# Patient Record
Sex: Female | Born: 1955 | Race: White | Hispanic: No | Marital: Married | Smoking: Current every day smoker
Health system: Southern US, Community
[De-identification: ages and names within clinical notes are randomized; demographics above are authoritative.]

## PROBLEM LIST (undated history)

## (undated) DIAGNOSIS — R569 Unspecified convulsions: Secondary | ICD-10-CM

## (undated) DIAGNOSIS — G43909 Migraine, unspecified, not intractable, without status migrainosus: Secondary | ICD-10-CM

## (undated) DIAGNOSIS — K900 Celiac disease: Secondary | ICD-10-CM

## (undated) DIAGNOSIS — E785 Hyperlipidemia, unspecified: Secondary | ICD-10-CM

## (undated) DIAGNOSIS — G8929 Other chronic pain: Secondary | ICD-10-CM

## (undated) DIAGNOSIS — M1990 Unspecified osteoarthritis, unspecified site: Secondary | ICD-10-CM

## (undated) DIAGNOSIS — R51 Headache: Secondary | ICD-10-CM

## (undated) DIAGNOSIS — R519 Headache, unspecified: Secondary | ICD-10-CM

## (undated) DIAGNOSIS — K635 Polyp of colon: Secondary | ICD-10-CM

## (undated) DIAGNOSIS — K5792 Diverticulitis of intestine, part unspecified, without perforation or abscess without bleeding: Secondary | ICD-10-CM

## (undated) DIAGNOSIS — D279 Benign neoplasm of unspecified ovary: Secondary | ICD-10-CM

## (undated) DIAGNOSIS — M542 Cervicalgia: Secondary | ICD-10-CM

## (undated) HISTORY — DX: Cervicalgia: M54.2

## (undated) HISTORY — DX: Headache, unspecified: R51.9

## (undated) HISTORY — PX: OTHER SURGICAL HISTORY: SHX169

## (undated) HISTORY — DX: Unspecified osteoarthritis, unspecified site: M19.90

## (undated) HISTORY — DX: Headache: R51

## (undated) HISTORY — PX: OOPHORECTOMY: SHX86

## (undated) HISTORY — DX: Unspecified convulsions: R56.9

## (undated) HISTORY — DX: Polyp of colon: K63.5

## (undated) HISTORY — DX: Hyperlipidemia, unspecified: E78.5

## (undated) HISTORY — DX: Benign neoplasm of unspecified ovary: D27.9

## (undated) HISTORY — PX: BREAST ENHANCEMENT SURGERY: SHX7

## (undated) HISTORY — DX: Diverticulitis of intestine, part unspecified, without perforation or abscess without bleeding: K57.92

## (undated) HISTORY — DX: Celiac disease: K90.0

## (undated) HISTORY — DX: Other chronic pain: G89.29

## (undated) HISTORY — DX: Migraine, unspecified, not intractable, without status migrainosus: G43.909

---

## 1999-02-21 ENCOUNTER — Encounter: Payer: Self-pay | Admitting: Family Medicine

## 1999-02-21 ENCOUNTER — Encounter: Admission: RE | Admit: 1999-02-21 | Discharge: 1999-02-21 | Payer: Self-pay | Admitting: Family Medicine

## 1999-03-31 ENCOUNTER — Ambulatory Visit (HOSPITAL_BASED_OUTPATIENT_CLINIC_OR_DEPARTMENT_OTHER): Admission: RE | Admit: 1999-03-31 | Discharge: 1999-03-31 | Payer: Self-pay | Admitting: Surgery

## 1999-03-31 ENCOUNTER — Encounter (INDEPENDENT_AMBULATORY_CARE_PROVIDER_SITE_OTHER): Payer: Self-pay | Admitting: *Deleted

## 1999-05-26 ENCOUNTER — Encounter (INDEPENDENT_AMBULATORY_CARE_PROVIDER_SITE_OTHER): Payer: Self-pay

## 1999-10-14 ENCOUNTER — Encounter: Payer: Self-pay | Admitting: Surgery

## 1999-10-14 ENCOUNTER — Encounter: Admission: RE | Admit: 1999-10-14 | Discharge: 1999-10-14 | Payer: Self-pay | Admitting: Surgery

## 2000-05-01 HISTORY — PX: AUGMENTATION MAMMAPLASTY: SUR837

## 2000-05-28 ENCOUNTER — Other Ambulatory Visit: Admission: RE | Admit: 2000-05-28 | Discharge: 2000-05-28 | Payer: Self-pay | Admitting: Family Medicine

## 2001-08-23 ENCOUNTER — Encounter: Payer: Self-pay | Admitting: Family Medicine

## 2001-08-23 ENCOUNTER — Encounter: Admission: RE | Admit: 2001-08-23 | Discharge: 2001-08-23 | Payer: Self-pay | Admitting: Family Medicine

## 2005-07-11 ENCOUNTER — Ambulatory Visit: Payer: Self-pay | Admitting: Internal Medicine

## 2005-07-18 ENCOUNTER — Ambulatory Visit: Payer: Self-pay | Admitting: Internal Medicine

## 2005-07-18 ENCOUNTER — Other Ambulatory Visit: Admission: RE | Admit: 2005-07-18 | Discharge: 2005-07-18 | Payer: Self-pay | Admitting: Internal Medicine

## 2005-09-07 ENCOUNTER — Ambulatory Visit: Payer: Self-pay | Admitting: Gastroenterology

## 2005-11-06 ENCOUNTER — Encounter: Payer: Self-pay | Admitting: Internal Medicine

## 2006-03-27 ENCOUNTER — Ambulatory Visit: Payer: Self-pay | Admitting: Internal Medicine

## 2006-08-02 ENCOUNTER — Encounter: Payer: Self-pay | Admitting: Internal Medicine

## 2006-08-02 ENCOUNTER — Other Ambulatory Visit: Admission: RE | Admit: 2006-08-02 | Discharge: 2006-08-02 | Payer: Self-pay | Admitting: Internal Medicine

## 2006-08-02 ENCOUNTER — Ambulatory Visit: Payer: Self-pay | Admitting: Internal Medicine

## 2006-08-16 ENCOUNTER — Encounter: Payer: Self-pay | Admitting: Internal Medicine

## 2006-10-05 DIAGNOSIS — E039 Hypothyroidism, unspecified: Secondary | ICD-10-CM

## 2006-10-09 DIAGNOSIS — Z860100 Personal history of colon polyps, unspecified: Secondary | ICD-10-CM | POA: Insufficient documentation

## 2007-07-19 ENCOUNTER — Encounter: Payer: Self-pay | Admitting: Internal Medicine

## 2007-07-30 ENCOUNTER — Encounter: Payer: Self-pay | Admitting: Internal Medicine

## 2007-08-06 ENCOUNTER — Encounter: Payer: Self-pay | Admitting: Internal Medicine

## 2007-08-06 ENCOUNTER — Ambulatory Visit: Payer: Self-pay | Admitting: Internal Medicine

## 2007-08-06 ENCOUNTER — Other Ambulatory Visit: Admission: RE | Admit: 2007-08-06 | Discharge: 2007-08-06 | Payer: Self-pay | Admitting: Internal Medicine

## 2007-08-06 DIAGNOSIS — F17200 Nicotine dependence, unspecified, uncomplicated: Secondary | ICD-10-CM | POA: Insufficient documentation

## 2007-11-21 ENCOUNTER — Ambulatory Visit: Payer: Self-pay | Admitting: Internal Medicine

## 2007-11-21 DIAGNOSIS — L723 Sebaceous cyst: Secondary | ICD-10-CM

## 2007-11-27 ENCOUNTER — Ambulatory Visit: Payer: Self-pay | Admitting: Internal Medicine

## 2007-12-09 ENCOUNTER — Telehealth: Payer: Self-pay | Admitting: Internal Medicine

## 2007-12-13 ENCOUNTER — Telehealth: Payer: Self-pay | Admitting: Internal Medicine

## 2009-09-17 ENCOUNTER — Encounter: Payer: Self-pay | Admitting: Internal Medicine

## 2009-09-20 ENCOUNTER — Ambulatory Visit: Payer: Self-pay | Admitting: Internal Medicine

## 2009-09-20 ENCOUNTER — Encounter (INDEPENDENT_AMBULATORY_CARE_PROVIDER_SITE_OTHER): Payer: Self-pay | Admitting: *Deleted

## 2009-10-04 ENCOUNTER — Ambulatory Visit: Payer: Self-pay | Admitting: Internal Medicine

## 2009-10-06 ENCOUNTER — Encounter: Payer: Self-pay | Admitting: Internal Medicine

## 2010-05-29 LAB — CONVERTED CEMR LAB: Pap Smear: NORMAL

## 2010-05-31 NOTE — Miscellaneous (Signed)
Summary: previsit/rm  Clinical Lists Changes  Medications: Added new medication of OSMOPREP 1.102-0.398 GM  TABS (SOD PHOS MONO-SOD PHOS DIBASIC) As per prep instructions. - Signed Rx of OSMOPREP 1.102-0.398 GM  TABS (SOD PHOS MONO-SOD PHOS DIBASIC) As per prep instructions.;  #32 x 0;  Signed;  Entered by: Sherren Kerns RN;  Authorized by: Hilarie Fredrickson MD;  Method used: Electronically to [facility]  [phone]*, [address] [place], [place], [place]  [address], Ph: [phone] or [phone], Fax: [phone] Observations: Added new observation of ALLERGY REV: Done (09/20/2009 12:50)    Prescriptions: OSMOPREP 1.102-0.398 GM  TABS (SOD PHOS MONO-SOD PHOS DIBASIC) As per prep instructions.  #32 x 0   Entered by:   Sherren Kerns RN   Authorized by:   Hilarie Fredrickson MD   Signed by:   Sherren Kerns RN on 09/20/2009   Method used:   Electronically to        [facility]  [phone]* (retail)       [address]       [place], [place]  [address]       Ph: [phone] or [phone]       Fax: [phone]   RxID:   [id number]

## 2010-05-31 NOTE — Procedures (Signed)
Summary: Colonoscopy  Patient: Sara Lawson Note: All result statuses are Final unless otherwise noted.  Tests: (1) Colonoscopy (COL)   COL Colonoscopy           DONE     [facility]     [address]     [place], [place]  [address]           COLONOSCOPY PROCEDURE REPORT           PATIENT:  Sara, Lawson  MR#:  [id number]     BIRTHDATE:  Dec 04, 1955, 54 yrs. old  GENDER:  female     ENDOSCOPIST:  Wilhemina Bonito. Eda Keys, MD     REF. BY:  .Direct / Self Referral     PROCEDURE DATE:  10/04/2009     PROCEDURE:  Colonoscopy with snare polypectomy X 2     ASA CLASS:  Class II     INDICATIONS:  history of polyps ; INDEX EXAM WITH DR Sara Lawson [place]     [place] 11[place]06-[place] ( RECTAL POLYP ? TYPE); UNCLES AND COUSIN     W/ COLON CA. DENIES ANY SYMPTOMS     MEDICATIONS:   Fentanyl 100 mcg IV, Versed 10 mg IV, Benadryl 50     mg IV           DESCRIPTION OF PROCEDURE:   After the risks benefits and     alternatives of the procedure were thoroughly explained, informed     consent was obtained.  Digital rectal exam was performed and     revealed no abnormalities.   The LB CF-[id number] endoscope     was introduced through the anus and advanced to the cecum, which     was identified by both the appendix and ileocecal valve, without     limitations.Time to cecum = 6:52 min.  The quality of the prep was     excellent, using MoviPrep.  The instrument was then slowly     withdrawn (time = 11:20 min) as the colon was fully examined.     <<PROCEDUREIMAGES>>           FINDINGS:  A diminutive polyp was found in the cecum. Polyp was     snared without cautery. Retrieval was successful.   A diminutive     polyp was found in the descending colon. Polyp was snared without     cautery. Retrieval was successful.  An isolated right colon     diverticulum. Moderate diverticulosis was found in the sigmoid     colon w/ rectosigmoid stenosis.   Retroflexed views in the rectum     revealed  internal hemorrhoids.    The scope was then withdrawn     from the patient and the procedure completed.           COMPLICATIONS:  None     ENDOSCOPIC IMPRESSION:     1) Diminutive polyp in the cecum - removed     2) Diminutive polyp in the descending colon - removed     3) Moderate diverticulosis in the sigmoid colon WITH STENOSIS     4) Internal hemorrhoids           RECOMMENDATIONS:     1) Follow up colonoscopy in 5 years     2) GET PATHOLOGY REPORT FROM [facility]ONOSCOPY WITH DR Sara Lawson     Lawson 11-06-05           ______________________________     Wilhemina Bonito. Sara Lawson  Sara Hageman, MD           CC:  Sara Jacobson, MD; The Patient           n.     eSIGNED:   Wilhemina Bonito. Eda Keys at 10/04/2009 10:52 AM           Selinda Flavin, [id number]  Note: An exclamation mark (!) indicates a result that was not dispersed into the flowsheet. Document Creation Date: 10/04/2009 10:53 AM _______________________________________________________________________  (1) Order result status: Final Collection or observation date-time: 10/04/2009 10:42 Requested date-time:  Receipt date-time:  Reported date-time:  Referring Physician:   Ordering Physician: Fransico Setters [phone]) Specimen Source:  Source: Launa Grill Order Number: [id number] Lab site:   Appended Document: Colonoscopy records requested and Keri will send as soon as possible.  The record is off site and they only pull those 1 x per month.  I explained that this is needed ASAP and she is going to request STAT.  Appended Document: Colonoscopy recall in 5 yrs/09-2014     Procedures Next Due Date:    Colonoscopy: 09/2014  Appended Document: Colonoscopy called Keri again to ask about the path report and she said they have still not been off site to retrieve those records and there is no way she can get them any sooner.  I asked to speak to her manager to see if they could go out sooner and she advised me the manager plans those trips and she was  suppose to go out next week for the records.  She advised me she was not there and would leave a message to call me.

## 2010-05-31 NOTE — Letter (Signed)
Summary: Osmoprep Instructions  [facility]  [address] [place], [place] [address]   Phone: [phone]  Fax: [phone]       Sara Lawson    [birth year]    MRN: [id number]        Procedure Day /Date: Monday 10/04/09     Arrival Time: 9:00 am     Procedure Time: 10:00 am     Location of Procedure:                    x  [facility] (4th Floor)   PREPARATION FOR COLONOSCOPY WITH OSMOPREP  Starting 5 days prior to your procedure  09/29/09 do not eat nuts, seeds, popcorn, corn, beans, peas,  salads, or any raw vegetables.  Do not take any fiber supplements (e.g. Metamucil, Citrucel, and Benefiber). _________________________________________________________________________________________________  THE DAY BEFORE YOUR PROCEDURE             DATE: 10/03/09     DAY: Sunday  1.   Drink clear liquids the entire day - NO SOLID FOOD.  Drink at least 64 oz. of fluid during the day to prevent hydration and help the prep work efficiently.    2.   Do not drink anything colored red or purple.  Avoid juices with pulp.  No orange juice.              CLEAR LIQUIDS INCLUDE: Water Jello Ice Popsicles Tea (sugar ok, no milk/cream) Powdered fruit flavored drinks Coffee (sugar ok, no milk/cream) Gatorade Juice: apple, white grape, white cranberry  Lemonade Clear bullion, consomm, broth Carbonated beverages (any kind) Strained chicken noodle soup Hard Candy   3.   Beginning at 5:00 p.m. or 6:00 p.m. the night before your procedure, drink one dose (4 tablets with 8 oz. of any clear liquid) every 15 minutes for a total of 5 doses (20 tablets total).  ___________________________________________________________________________________________________   THE DAY OF YOUR PROCEDURE            DATE: 10/04/09      DAY: Monday  1.   Beginning at  5:00 a.m. (5 hours before procedure), drink one dose (4 tablets with 8 oz. of any clear liquid) every 15 minutes for a total of 3  doses (12 tablets).  2.   You may drink clear liquids until  8:00 a.m.(2 hours before exam).  Do not drink anything after this time.       MEDICATION INSTRUCTIONS  Unless otherwise instructed, you should take regular prescription medications with a small sip of water as early as possible the morning of your procedure.     Additional medication instructions: n/a          OTHER INSTRUCTIONS  You will need a responsible adult at least 56 years of age to accompany you and drive you home.   This person must remain in the waiting room during your procedure.  Wear loose fitting clothing that is easily removed.  Leave jewelry and other valuables at home.  However, you may wish to bring a book to read or an iPod/MP3 player to listen to music as you wait for your procedure to start.  Remove all body piercing jewelry and leave at home.  Total time from sign-in until discharge is approximately 2-3 hours.  You should go home directly after your procedure and rest.  You can resume normal activities the day after your procedure.  The day of your procedure you should not:  Drive   Make legal decisions   Operate machinery   Drink alcohol   Return to work  You will receive specific instructions about eating, activities and medications before you leave.   The above instructions have been reviewed and explained to me by   Sherren Kerns RN  Sep 20, 2009 1:30 PM    I fully understand and can verbalize these instructions _____________________________ Date _________

## 2010-05-31 NOTE — Procedures (Signed)
Summary: Colon/[facility]  [facility]   Imported By: Lester Wabeno 02/03/2010 08:51:15  _____________________________________________________________________  External Attachment:    Type:   Image     Comment:   External Document

## 2010-05-31 NOTE — Letter (Signed)
Summary: Patient Notice- Polyp Results  [facility]  [address] [place], [place] [address]   Phone: [phone]  Fax: [phone]        October 06, 2009 MRN: [id number]    Sara Lawson [address] [place], [place]  [address]    Dear [name],  I am pleased to inform you that the colon polyp(s) removed during your recent colonoscopy was (were) found to be benign (no cancer detected) upon pathologic examination.  I recommend you have a repeat colonoscopy examination in 5 years to look for recurrent polyps, as having colon polyps increases your risk for having recurrent polyps or even colon cancer in the future.  Should you develop new or worsening symptoms of abdominal pain, bowel habit changes or bleeding from the rectum or bowels, please schedule an evaluation with either your primary care physician or with me.  Additional information/recommendations:  __ No further action with gastroenterology is needed at this time. Please      follow-up with your primary care physician for your other healthcare      needs.   Please call us if you are having persistent problems or have questions about your condition that have not been fully answered at this time.  Sincerely,  Hilarie Fredrickson MD  This letter has been electronically signed by your physician.  Appended Document: Patient Notice- Polyp Results letter mailed.

## 2010-09-16 NOTE — Op Note (Signed)
[facility]  Patient:    Sara Lawson               MRN: [id number] Proc. Date: 03/31/99 Adm. Date:  [id number] Attending:  Charlton Haws CC:         Duncan Dull, M.D.             Dr. Jena Gauss, DRI                           Operative Report  PREOPERATIVE DIAGNOSIS:  Right breast mass, upper outer quadrant.  POSTOPERATIVE DIAGNOSIS:  Right breast mass, upper outer quadrant.  OPERATION PERFORMED:  Removal of right breast mass.  SURGEON:  Currie Paris, M.D.  ANESTHESIA:  MAC.  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  After discussion with the patient, we initially planned to do some large core biopsies of these, but she elected to have the palpable one removed to see what that was before proceeding with her desire to simply follow these others by ultrasound for changes.  DESCRIPTION OF PROCEDURE:  The patient was brought to the operating room and prior to being given any sedation, the breast was examined and the palpable mass identified both by the patient and myself and marked.  She was then given IV sedation and the breast was prepped and draped.  1% Xylocaine was infiltrated and the incision was made in the upper outer quadrant of the breast overlying the mass. I divided some subcutaneous tissues and could palpate the mass within what appeared to be very dense fibrotic breast tissue.  I put a 3-0 Vicryl through the mass so I could get some traction on it to keep it from moving around and then excised it  with a rim of breast tissue completely around it so that it came out  intact without actually getting into the mass itself.  This was done entirely with the cautery.  When the mass was removed, the wound was irrigated and hemostasis achieved with the cautery.  When everything appeared to dry, the breast was closed with 3-0 Vicryl in layers followed by 4-0 Monocryl subcuticular plus Steri-Strips.  The patient  tolerated the procedure well.  There were no operative complications.  All counts were correct. DD:  03/31/99 TD:  03/31/99 Job: [id number]

## 2011-01-16 ENCOUNTER — Other Ambulatory Visit: Payer: Self-pay | Admitting: Family Medicine

## 2011-01-16 DIAGNOSIS — F17200 Nicotine dependence, unspecified, uncomplicated: Secondary | ICD-10-CM | POA: Insufficient documentation

## 2011-01-16 DIAGNOSIS — Z1231 Encounter for screening mammogram for malignant neoplasm of breast: Secondary | ICD-10-CM

## 2011-01-16 DIAGNOSIS — Z780 Asymptomatic menopausal state: Secondary | ICD-10-CM

## 2011-01-17 ENCOUNTER — Other Ambulatory Visit: Payer: Self-pay | Admitting: Family Medicine

## 2011-01-17 DIAGNOSIS — R102 Pelvic and perineal pain: Secondary | ICD-10-CM

## 2011-01-20 ENCOUNTER — Other Ambulatory Visit: Payer: Self-pay

## 2011-01-23 ENCOUNTER — Ambulatory Visit
Admission: RE | Admit: 2011-01-23 | Discharge: 2011-01-23 | Disposition: A | Discharge status: Home / Self Care | Payer: 59 | Source: Ambulatory Visit | Attending: Family Medicine | Admitting: Family Medicine

## 2011-01-23 DIAGNOSIS — R102 Pelvic and perineal pain: Secondary | ICD-10-CM

## 2011-01-25 ENCOUNTER — Ambulatory Visit: Payer: Self-pay

## 2011-01-27 ENCOUNTER — Other Ambulatory Visit: Payer: Self-pay

## 2011-01-27 ENCOUNTER — Ambulatory Visit: Payer: Self-pay

## 2011-08-24 ENCOUNTER — Other Ambulatory Visit (HOSPITAL_COMMUNITY): Payer: Self-pay | Admitting: Nurse Practitioner

## 2011-08-24 DIAGNOSIS — Z1231 Encounter for screening mammogram for malignant neoplasm of breast: Secondary | ICD-10-CM

## 2011-09-22 ENCOUNTER — Ambulatory Visit (HOSPITAL_COMMUNITY)
Admission: RE | Admit: 2011-09-22 | Discharge: 2011-09-22 | Disposition: A | Discharge status: Home / Self Care | Payer: 59 | Source: Ambulatory Visit | Attending: Nurse Practitioner | Admitting: Nurse Practitioner

## 2011-09-22 DIAGNOSIS — Z1231 Encounter for screening mammogram for malignant neoplasm of breast: Secondary | ICD-10-CM | POA: Insufficient documentation

## 2012-08-22 ENCOUNTER — Telehealth: Payer: Self-pay | Admitting: Neurology

## 2012-08-22 NOTE — Telephone Encounter (Signed)
Patient called because insurance needed justification for her brand medications. I called her back and advised her that our records show that justifications have been sent and approved through December, 2014. Patient is very appreciative of our immediate follow through.

## 2012-08-23 ENCOUNTER — Telehealth: Payer: Self-pay

## 2012-08-23 NOTE — Telephone Encounter (Signed)
Patient called front desk saying [employer] needs [place] to fax forms saying she cannot take generic medications.  I will initiate prior auth.  Must wait for ins response.

## 2012-08-26 ENCOUNTER — Telehealth: Payer: Self-pay

## 2012-08-26 NOTE — Telephone Encounter (Signed)
[employer] sent correspondence stating they do not handle medication benefit claims for this patient.  They indicated we need to contact [facility] instead.  I have completed and faxed the forms required to [facility].  Pending response.

## 2012-09-04 DIAGNOSIS — B9681 Helicobacter pylori [H. pylori] as the cause of diseases classified elsewhere: Secondary | ICD-10-CM | POA: Insufficient documentation

## 2012-09-04 DIAGNOSIS — K219 Gastro-esophageal reflux disease without esophagitis: Secondary | ICD-10-CM | POA: Insufficient documentation

## 2012-09-11 ENCOUNTER — Telehealth: Payer: Self-pay | Admitting: Internal Medicine

## 2012-09-11 NOTE — Telephone Encounter (Signed)
Patient calling to set up colonoscopy and EGD. Explained to her that if she is having problems she would need OV first. She states she is having bloating and abdominal pain after eating. States she was also treated for h.pylori in the past by her PCP. Offered OV with extender or in June with Dr. Marina Goodell. Scheduled patient on 09/16/12 at 8:30 AM with Willette Cluster, NP.

## 2012-09-16 ENCOUNTER — Ambulatory Visit: Payer: 59 | Admitting: Nurse Practitioner

## 2012-09-20 ENCOUNTER — Telehealth: Payer: Self-pay

## 2012-09-20 NOTE — Telephone Encounter (Signed)
Patient called regarding her Medication.  She says the pharmacy told her a prior Berkley Harvey was needed on Keppra and Vimpat.  I already submitted the prior auth forms and received a response saying "Prior Authorization is not required at this time.  The pharmacy may contact the Help Desk at [phone] for further assistance with claims."  I told the patient I would call the insurance back to try and figure out what is going on since the pharmacy is saying it needs a PA, but the ins is saying it does not.

## 2012-09-20 NOTE — Telephone Encounter (Signed)
(  My computer froze and previous encounter was closed in error)   I called and spoke with Rodney Booze at [facility] at [phone].  They first told me the medication was already approved until 04/30/2013.  Then they said they may have gotten the medications mixed up and the approval was only good for one med, not both.  They asked me to resubmit the prior auth info, which I did.   Insurance company just faxed [place] a denial letter for Keppra XR Brand Name.  They say the patient does not meet the conditions necessary for coverage.  I called the insurance back again.  Spoke with [employer].  She confirmed the request was denied.  She said they want the patient to try generic Keppra XR for at least 4 weeks.  Although I advised them she has already tried and failed generic Keppra, they still denied the PA.  There was nothing more she could do for me.  I asked her where I could call to appeal.  She said [phone].  I called this number multiple times and it is a fax.  I called [facility] again and they said the number given was the only number listed for appeals.  A letter of med necessity for appeal may be faxed to [phone] or mailed to [facility] [address] [place], [place] [address].    I called the patient at home, got no answer.  Left message.  Called cell, got no answer.  Left message.  The patient called me back right away.  She said the number she has [phone] for Provider Services.  I told her I would call them and try to do an appeal via phone.  This was an automated system that did not provide me with any info.  I spoke with the patient again.  She said she spoke with [facility] and they said I would need to call them back and do an appeal over the phone.  She said although we noted she had tried and failed generic, we did not specify how long she tried the generic, therefore they denied the request.  I called ins back at [phone].  Spoke with [place].  She was not able to assist me.  She transferred me  to

## 2012-09-24 NOTE — Telephone Encounter (Signed)
This medication was finally approved.  (I was on the phone with ins on and off for nearly 2 hours) Approval Ref # [id number] effective 09/20/2012-09/20/2017.

## 2012-10-31 ENCOUNTER — Ambulatory Visit: Payer: Self-pay | Admitting: Neurology

## 2012-10-31 ENCOUNTER — Encounter: Payer: Self-pay | Admitting: Neurology

## 2012-12-17 ENCOUNTER — Other Ambulatory Visit: Payer: Self-pay | Admitting: Neurology

## 2012-12-17 ENCOUNTER — Other Ambulatory Visit: Payer: Self-pay | Admitting: Gastroenterology

## 2012-12-17 DIAGNOSIS — R1013 Epigastric pain: Secondary | ICD-10-CM

## 2012-12-18 NOTE — Telephone Encounter (Signed)
Rx signed and faxed.

## 2012-12-31 ENCOUNTER — Ambulatory Visit (HOSPITAL_COMMUNITY)
Admission: RE | Admit: 2012-12-31 | Discharge: 2012-12-31 | Disposition: A | Discharge status: Home / Self Care | Payer: 59 | Source: Ambulatory Visit | Attending: Gastroenterology | Admitting: Gastroenterology

## 2012-12-31 ENCOUNTER — Encounter (HOSPITAL_COMMUNITY)
Admission: RE | Admit: 2012-12-31 | Discharge: 2012-12-31 | Disposition: A | Discharge status: Home / Self Care | Payer: 59 | Source: Ambulatory Visit | Attending: Gastroenterology | Admitting: Gastroenterology

## 2012-12-31 DIAGNOSIS — R1013 Epigastric pain: Secondary | ICD-10-CM

## 2012-12-31 MED ORDER — TECHNETIUM TC 99M MEBROFENIN IV KIT
5.2000 | PACK | Freq: Once | INTRAVENOUS | Status: AC | PRN
  Administered 2012-12-31: 5 via INTRAVENOUS

## 2012-12-31 MED ORDER — SINCALIDE 5 MCG IJ SOLR
0.0200 ug/kg | Freq: Once | INTRAMUSCULAR | Status: AC
  Administered 2012-12-31: 11:00:00 via INTRAVENOUS

## 2013-01-15 ENCOUNTER — Other Ambulatory Visit: Payer: Self-pay | Admitting: Gastroenterology

## 2013-01-15 DIAGNOSIS — R1013 Epigastric pain: Secondary | ICD-10-CM

## 2013-01-21 ENCOUNTER — Other Ambulatory Visit: Payer: 59

## 2013-01-30 ENCOUNTER — Ambulatory Visit
Admission: RE | Admit: 2013-01-30 | Discharge: 2013-01-30 | Disposition: A | Discharge status: Home / Self Care | Payer: 59 | Source: Ambulatory Visit | Attending: Gastroenterology | Admitting: Gastroenterology

## 2013-01-30 ENCOUNTER — Other Ambulatory Visit: Payer: 59

## 2013-01-30 ENCOUNTER — Ambulatory Visit (HOSPITAL_COMMUNITY): Payer: 59

## 2013-01-30 DIAGNOSIS — R1013 Epigastric pain: Secondary | ICD-10-CM

## 2013-01-30 MED ORDER — IOHEXOL 300 MG/ML  SOLN
100.0000 mL | Freq: Once | INTRAMUSCULAR | Status: AC | PRN
  Administered 2013-01-30: 100 mL via INTRAVENOUS

## 2013-02-10 DIAGNOSIS — K900 Celiac disease: Secondary | ICD-10-CM | POA: Insufficient documentation

## 2013-04-17 ENCOUNTER — Telehealth: Payer: Self-pay

## 2013-04-17 NOTE — Telephone Encounter (Signed)
[facility] sent [place] a letter stating they have approved our request for coverage on Keppra XR effective until 04/14/2018 Ref # [id number]

## 2013-04-17 NOTE — Telephone Encounter (Signed)
[facility] sent [place] a letter stating they have approved our request for coverage on Vimpat effective until 04/14/2018 Ref # [id number]

## 2013-04-23 ENCOUNTER — Other Ambulatory Visit: Payer: Self-pay | Admitting: Neurology

## 2013-04-23 MED ORDER — LACOSAMIDE 200 MG PO TABS: ORAL_TABLET | ORAL | Status: DC

## 2013-08-05 ENCOUNTER — Telehealth: Payer: Self-pay | Admitting: Neurology

## 2013-08-05 MED ORDER — KEPPRA XR 500 MG PO TB24: 1000.0000 mg | ORAL_TABLET | Freq: Every evening | ORAL | Status: DC

## 2013-08-05 NOTE — Telephone Encounter (Signed)
Rx has been sent  

## 2013-08-05 NOTE — Telephone Encounter (Signed)
Patient called to request refill of Keppra, states her last dose will be on April 13 and then she will need another 30 days called in. Patient scheduled follow up appointment with Cecille Rubin on 09/11/13.

## 2013-09-02 ENCOUNTER — Other Ambulatory Visit: Payer: Self-pay | Admitting: Neurology

## 2013-09-11 ENCOUNTER — Encounter: Payer: Self-pay | Admitting: Nurse Practitioner

## 2013-09-11 ENCOUNTER — Encounter (INDEPENDENT_AMBULATORY_CARE_PROVIDER_SITE_OTHER): Payer: Self-pay

## 2013-09-11 ENCOUNTER — Ambulatory Visit (INDEPENDENT_AMBULATORY_CARE_PROVIDER_SITE_OTHER): Payer: 59 | Admitting: Nurse Practitioner

## 2013-09-11 VITALS — BP 116/76 | HR 66 | Ht 65.0 in | Wt 164.0 lb

## 2013-09-11 DIAGNOSIS — R569 Unspecified convulsions: Secondary | ICD-10-CM

## 2013-09-11 DIAGNOSIS — G40209 Localization-related (focal) (partial) symptomatic epilepsy and epileptic syndromes with complex partial seizures, not intractable, without status epilepticus: Secondary | ICD-10-CM | POA: Insufficient documentation

## 2013-09-11 DIAGNOSIS — Z5181 Encounter for therapeutic drug level monitoring: Secondary | ICD-10-CM | POA: Insufficient documentation

## 2013-09-11 MED ORDER — LACOSAMIDE 200 MG PO TABS: ORAL_TABLET | ORAL | Status: DC

## 2013-09-11 MED ORDER — KEPPRA XR 500 MG PO TB24: 1000.0000 mg | ORAL_TABLET | Freq: Every day | ORAL | Status: DC

## 2013-09-11 NOTE — Progress Notes (Signed)
[facility]  PATIENT: Sara Lawson DOB: [birth year]   REASON FOR VISIT: Followup for seizure disorder   HISTORY OF PRESENT ILLNESS: Sara Lawson, 58 year old female returns for followup. She has a history of complex partial seizure disorder. Last seizure was 2/24/ 2014. She was last seen in this office in 06/26/2012 Dr. Krista Blue. Her Vimpat dose was increased at that time and she has not had further seizure activity. She denies any side effects to Keppra or Vimpat. She has not had any falls, she returns for reevaluation.   HISTORY: She started to have seizure since Jan 2010 after she felt and slammed her head at an  ice cooler at gas station, no LOC,  after that she started to have the episodes. She has past medical history of hypothyroidism, thyroid cysts, chronic neck pain, headache, presenting with 6 month history of intermittent episodes of confusion. She reported all  presentation has similar seminology.  She would  suddenly lost connection with the reality, feel like " I am not there".  never totally lost consciousness,  sometimes,  it's proceed by smell some strong chemical smells,  or  exhaustion, stomach knot like sensation, with nausea.  MRI :mild-moderate atrophy, EEG normal. Jun 26 2012  She continues to have recurrent seizures, sometimes multiple episodes in a day, over years, she was changed to Edisto XR, currently taking 500 mg 2 tablets every night, also on titrating dose of Vimpat, she is taking 150 mg twice a day,   Last seizure was in June 2013, in June 24 2012, she woke up having recurrent episode of seizure every 30-60 minutes, for a total of 7 episodes, stomach cramping, overwhelming sensation, lasting for 1 minutes, she took extra dose of 150 mg vimpat, one more recurrent episode afterwards, and then stop, she denies significant side effects from medications    REVIEW OF SYSTEMS: Full 14 system review of systems performed and notable only for those  listed, all others are neg:  Constitutional: N/A  Cardiovascular: N/A  Ear/Nose/Throat: N/A  Skin: N/A  Eyes: N/A  Respiratory: N/A  Gastroitestinal: N/A  Hematology/Lymphatic: N/A  Endocrine: N/A Musculoskeletal:N/A  Allergy/Immunology: N/A  Neurological: Seizure disorder  Psychiatric: N/A Sleep : NA   ALLERGIES: Allergies  Allergen Reactions  . Penicillins     REACTION: rash---she is able to take cephalosporins    HOME MEDICATIONS: Outpatient Prescriptions Prior to Visit  Medication Sig Dispense Refill  . KEPPRA XR 500 MG 24 hr tablet TAKE 2 TABLETS (1,000 MG TOTAL) BY MOUTH NIGHTLY.  60 tablet  0  . lacosamide (VIMPAT) 200 MG TABS tablet TAKE 1 TABLET BY MOUTH TWICE DAILY  60 tablet  5  . ALPRAZOLAM PO Take by mouth as needed.      Marland Kitchen omeprazole (PRILOSEC) 40 MG capsule        No facility-administered medications prior to visit.    PAST MEDICAL HISTORY: Past Medical History  Diagnosis Date  . Chronic head pain   . Chronic neck pain   . Migraine   . Seizure Celiac disease     PAST SURGICAL HISTORY: Past Surgical History  Procedure Laterality Date  . Appendix removed      FAMILY HISTORY: History reviewed. No pertinent family history.  SOCIAL HISTORY: History   Social History  . Marital Status: Married    Spouse Name: Sara Lawson    Number of Children: 3  . Years of Education: N/A   Occupational History  . Not on file.   Social  History Main Topics  . Smoking status: Current Every Day Smoker  . Smokeless tobacco: Never Used  . Alcohol Use: Not on file  . Drug Use: Not on file  . Sexual Activity: Not on file   Other Topics Concern  . Not on file   Social History Narrative   Patient lives at home with her husband Sara Lawson and been married for 10 years.    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   Patient has 3 children.   Patient has some college education.   Patient drinks 6 cups of coffee daily.            PHYSICAL EXAM  Filed Vitals:    09/11/13 0938  BP: 116/76  Pulse: 66  Height: 5\' 5"  (1.651 m)  Weight: 164 lb (74.39 kg)   Body mass index is 27.29 kg/(m^2).  Generalized: Well developed, in no acute distress  Head: normocephalic and atraumatic,. Oropharynx benign  Neck: Supple, no carotid bruits  Cardiac: Regular rate rhythm, no murmur  Musculoskeletal: No deformity   Neurological examination   Mentation: Alert oriented to time, place, history taking. Follows all commands speech and language fluent  Cranial nerve II-XII: Fundoscopic exam reveals sharp disc margins.Pupils were equal round reactive to light extraocular movements were full, visual field were full on confrontational test. Facial sensation and strength were normal. hearing was intact to finger rubbing bilaterally. Uvula tongue midline. head turning and shoulder shrug were normal and symmetric.Tongue protrusion into cheek strength was normal. Motor: normal bulk and tone, full strength in the BUE, BLE,  No focal weakness Coordination: finger-nose-finger, heel-to-shin bilaterally, no dysmetria Reflexes: Brachioradialis 2/2, biceps 2/2, triceps 2/2, patellar 2/2, Achilles 2/2, plantar responses were flexor bilaterally. Gait and Station: Rising up from seated position without assistance, normal stance,  moderate stride, good arm swing, smooth turning, able to perform tiptoe, and heel walking without difficulty. Tandem gait is steady  DIAGNOSTIC DATA (LABS, IMAGING, TESTING) - ASSESSMENT AND PLAN  58 y.o. year old female  has a past medical history of complex partial seizure, with last seizure in February  2014. She is currently on Keppra and Vimpat without side effects.  Continue Vimpat and Keppra at current dose will refill Followup yearly and  when necessary Dennie Bible, [facility], [facility], [facility] [address], [place], [place] [address] [phone]

## 2013-09-11 NOTE — Patient Instructions (Signed)
Continue Vimpat and Keppra at current dose will refill Followup yearly and  when necessary

## 2013-12-05 ENCOUNTER — Encounter: Payer: Self-pay | Admitting: Internal Medicine

## 2014-04-01 ENCOUNTER — Other Ambulatory Visit: Payer: Self-pay

## 2014-04-01 MED ORDER — LACOSAMIDE 200 MG PO TABS: ORAL_TABLET | ORAL | Status: DC

## 2014-04-08 ENCOUNTER — Telehealth: Payer: Self-pay | Admitting: Nurse Practitioner

## 2014-04-08 NOTE — Telephone Encounter (Signed)
Rx was already sent on 12/02.  I called the pharmacy.  They have Rx and will fill it today.  I called the patient back.  She is aware.

## 2014-04-08 NOTE — Telephone Encounter (Signed)
Patient stated Pharmacy has been trying to get Rx refill request for  lacosamide (VIMPAT) 200 MG TABS tablet for over a week.  Please call and advise.

## 2014-07-22 ENCOUNTER — Ambulatory Visit (INDEPENDENT_AMBULATORY_CARE_PROVIDER_SITE_OTHER): Payer: 59 | Admitting: Obstetrics & Gynecology

## 2014-07-22 ENCOUNTER — Encounter: Payer: Self-pay | Admitting: Obstetrics & Gynecology

## 2014-07-22 VITALS — BP 122/78 | HR 72 | Resp 16 | Ht 64.5 in | Wt 167.4 lb

## 2014-07-22 DIAGNOSIS — R938 Abnormal findings on diagnostic imaging of other specified body structures: Secondary | ICD-10-CM

## 2014-07-22 DIAGNOSIS — Z124 Encounter for screening for malignant neoplasm of cervix: Secondary | ICD-10-CM

## 2014-07-22 DIAGNOSIS — N950 Postmenopausal bleeding: Secondary | ICD-10-CM | POA: Diagnosis not present

## 2014-07-22 DIAGNOSIS — R9389 Abnormal findings on diagnostic imaging of other specified body structures: Secondary | ICD-10-CM

## 2014-07-22 MED ORDER — ESTRADIOL 0.1 MG/GM VA CREA: TOPICAL_CREAM | VAGINAL | Status: DC

## 2014-07-22 NOTE — Progress Notes (Signed)
59 y.o. G3P3 MarriedCaucasianF here for new patient evaluation.  PCP:  Vicenta Aly.  Pt referred due to episode of PMP bleeding that occurrred about two months ago.  This was while she had a UTI.  Was also on antibiotics per pt's hx.  Denied other symptoms but reports she does have some lower pelvic pain.  No urinary changes now.  Reports bowel habits are "normal for her".  Accompanied by spouse today.  Menopausal for a little over 10 years.  No h/o HRT use.  Patient's last menstrual period was 05/01/2004.          Sexually active: No.  The current method of family planning is post menopausal status.    Exercising: No.  not regularly Smoker:  Current every day smoker  Health Maintenance: Pap:  3 years ago History of abnormal Pap:  yes MMG:  09/22/11-normal Colonoscopy:  2014-repeat in 5 years, Dr. Collene Mares BMD:   2003 TDaP:  2015    reports that she has been smoking.  She has never used smokeless tobacco. She reports that she drinks alcohol. She reports that she does not use illicit drugs.  Past Medical History  Diagnosis Date  . Chronic head pain   . Chronic neck pain   . Migraine     not currently  . Seizure   . Celiac disease     diag 2014 Dr Collene Mares    Past Surgical History  Procedure Laterality Date  . Appendix removed    . 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 Breast enhancement surgery      Current Outpatient Prescriptions  Medication Sig Dispense Refill  . KEPPRA XR 500 MG 24 hr tablet Take 2 tablets (1,000 mg total) by mouth daily. 60 tablet 11  . lacosamide (VIMPAT) 200 MG TABS tablet TAKE 1 TABLET BY MOUTH TWICE DAILY 60 tablet 5  . SYNTHROID 100 MCG tablet      No current facility-administered medications for this visit.    Family History  Problem Relation Age of Onset  . Colon cancer Mother     had hysterectomy in late 30s-not sure if due to any problems, ? Celiac disease  . Colon cancer Maternal Uncle     x2  . Hypertension Mother   .  Diabetes Mother   . Diabetes Maternal Grandmother   . Diabetes Maternal Uncle     x2  . Diabetes Maternal Uncle   . Stroke Other     maternal great grandfather-diabetes  . Heart disease Mother   . Dementia Mother   . Thyroid disease Other     maternal side  . Diabetes Sister   . Diabetes Brother     type 2    ROS:  Pertinent items are noted in HPI.  Otherwise, a comprehensive ROS was negative.  Exam:   BP 122/78 mmHg  Pulse 72  Resp 16  Ht 5' 4.5" (1.638 m)  Wt 167 lb 6.4 oz (75.932 kg)  BMI 28.30 kg/m2  LMP 05/01/2004  Breastfeeding? No    Height: 5' 4.5" (163.8 cm)  Ht Readings from Last 3 Encounters:  07/22/14 5' 4.5" (1.638 m)  09/11/13 5\' 5"  (1.651 m)  08/06/07 5\' 4"  (1.626 m)    General appearance: alert, cooperative and appears stated age Head: Normocephalic, without obvious abnormality, atraumatic Neck: no adenopathy, supple, symmetrical, trachea midline and thyroid normal to inspection and palpation Lungs: clear to auscultation bilaterally Breasts: normal appearance, no masses or  tenderness Heart: regular rate and rhythm Abdomen: soft, non-tender; bowel sounds normal; no masses,  no organomegaly Extremities: extremities normal, atraumatic, no cyanosis or edema Skin: Skin color, texture, turgor normal. No rashes or lesions Lymph nodes: Cervical, supraclavicular, and axillary nodes normal. No abnormal inguinal nodes palpated Neurologic: Grossly normal   Pelvic: External genitalia:  no lesions              Urethra:  normal appearing urethra with no masses, tenderness or lesions              Bartholins and Skenes: normal                 Vagina: normal appearing vagina with normal color and discharge, no lesions              Cervix: no lesions              Pap taken: Yes.   Bimanual Exam:  Uterus:  normal size, contour, position, consistency, mobility, non-tender              Adnexa: normal adnexa and no mass, fullness, tenderness               Rectovaginal:  Confirms               Anus:  normal sphincter tone, no lesions  Endometrial biopsy recommended.  Discussed with patient.  Verbal and written consent obtained.   Procedure:  Speculum placed.  Cervix visualized and cleansed with betadine prep.  A single toothed tenaculum was applied to the anterior lip of the cervix.  Milex dilator was needed for cervical dilation.  Endometrial pipelle was advanced through the cervix into the endometrial cavity without difficulty.  Pipelle passed to 6.5cm.  Suction applied and pipelle removed with good tissue sample obtained.  Tenculum removed.  No bleeding noted.  Patient tolerated procedure well.  Chaperone was present for exam.  A:  Thickened endometrium Possible endometrial polyp PMP bleeding RLQ pain Breast implants  P:   Endometrial biopsy pending Pap with HR HPV obtained Plan to proceed with CT abd/pelvis if nothing is found on biopsy but if polyp tissue is noted, will have pt return for [facility]. Estrace cream topically to urethra every other day MMG recommended as well

## 2014-07-24 LAB — IPS PAP TEST WITH REFLEX TO HPV

## 2014-07-30 ENCOUNTER — Telehealth: Payer: Self-pay | Admitting: Obstetrics & Gynecology

## 2014-07-30 DIAGNOSIS — N840 Polyp of corpus uteri: Secondary | ICD-10-CM

## 2014-07-30 NOTE — Telephone Encounter (Signed)
-----   Message from Megan Salon, MD sent at 07/28/2014  1:19 PM EDT ----- Inform pt pap is normal and biopsy showed piece of endometrial polyp.  Needs SHGM to see how large polyp is and to see if needs removal.

## 2014-07-30 NOTE — Telephone Encounter (Signed)
Ativan 1.0mg  po to be taken 1 hour before coming.  #5/0RF Cytotec 132mcg placed vaginally pm and am before ultrasound procedure.  This may make her cramp some so she can use anti-inflammatories if needed.

## 2014-07-30 NOTE — Telephone Encounter (Signed)
Patient is calling to get her results from her last visit.

## 2014-07-30 NOTE — Telephone Encounter (Signed)
Spoke with patient and message from Dr. Sabra Heck given. Patient agreeable to planning Sonohysterogram and appointment scheduled for 08/06/14. Order placed.   Patient states she would like premedication treatment as she is very nervous about further procedures after endometrial biopsy. Patient states she will have a ride to procedure. Advised would request Dr. Sabra Heck review and obtain plan of care and return her call. She is agreeable to this.

## 2014-07-31 NOTE — Telephone Encounter (Signed)
Pt left voicemail morning of 07/31/14 to cancel her appointment. Returned call. Pt states she does not wish to reschedule at this time. Pt states "the test results came back negative and I don't want to put myself through that if the results were negative." Pt agreeable to nurse return call.  Pts ultrasound appointments cancelled per pt request.  Pt best: [phone]

## 2014-08-03 NOTE — Telephone Encounter (Signed)
Dr. Sabra Heck can you review and advise as patient does not wish to reschedule.

## 2014-08-04 ENCOUNTER — Encounter: Payer: Self-pay | Admitting: Obstetrics & Gynecology

## 2014-08-06 ENCOUNTER — Other Ambulatory Visit: Payer: 59 | Admitting: Obstetrics & Gynecology

## 2014-08-06 ENCOUNTER — Other Ambulatory Visit: Payer: 59

## 2014-08-07 NOTE — Telephone Encounter (Signed)
Reviewed with Dr Sabra Heck. Call to patient to discuss Dr Ammie Ferrier recommendations.  Patient states she does not feel SHGM is necessary "since not cancer." States endometrial biopsy was very painful. Advised although biopsy result is negative, this is incomplete evaluation.  Discussed the need to complete evaluation of endometrial cavity and/or removal of polyp. If feels unable to tolerate [facility], can proceed with Hysteroscopy, polyp resection, D&C at hospital.  Patient prefers this option. Advised can proceed with precert and scheduling. Dr Sabra Heck will discuss procedure further at surgery consult to be scheduled once procedure date confirmed.  Advised patient recommed scheduling in next four weeks.  She is agreeable.    Routing to provider for final review. Patient agreeable to disposition. Will close encounter

## 2014-08-07 NOTE — Telephone Encounter (Signed)
Gay Filler, Can you call pt before I write a letter.  She had PMP bleeding with endometrial polyp on pathology.  She needs [facility] for further evaluation or hysteroscopy to just remove the polyp.  CC: Karen Chafe

## 2014-08-10 ENCOUNTER — Telehealth: Payer: Self-pay | Admitting: Obstetrics & Gynecology

## 2014-08-10 NOTE — Telephone Encounter (Signed)
Call to patient to go over surgery benefits. Patient states that she does not want to proceed with the surgery. States that she doesn't deem it imperative since it has already been determined that there is no cancer. I advised patient that I would send message to provider and that she would be contacted if there were additional concerns to be addressed. Patient agreeable.

## 2014-08-10 NOTE — Telephone Encounter (Signed)
See phone note from 08-07-14. I spoke with patient in detail regarding Dr Ammie Ferrier recommendation for further evaluation with sonohystogram to evaluate polyp and uterine cavity or proceed with polyp resection. Patient preferred to proceed with Hysteroscopy/polyp resection/D&C. Chart sent to precert and patient advised Sabrina in billing department that she did not wish to proceed.   Call back to patient, VM confirms confirm correct phone number. Left message to call back to Gay Filler, nurse manager that she spoke to on Friday.   Dr Sabra Heck, [facility] [place]. I have called patient and left message. Any additional instruction?

## 2014-08-13 NOTE — Telephone Encounter (Signed)
Call to patient to follow-up on recommended SHGM versus Hysteroscopy/polyp resection/D&C. See previous phone note. Again discussed Dr Ammie Ferrier recommendations for additional evaluation of endometrial cavity and possible removal of polyp. Patient initially preferred to proceed to Hysteroscopy/D&C but now declines to schedule. Has multiple questions regarding pros and cons of each procedure, likelihood that [facility] would still result in need for hysteroscopy and need to proceed with any procedure at all. Advised I recommend she come in to office for consult to discuss all options with Dr Sabra Heck. Patient only willing too do this if would need pre-op visit anyway. Advised this would be required and if decides to proceed with hysteroscopy, this could count as pre-op if surgery is done within 30 days. Patient agreeable to consult and scheduled for 08-31-14 with Dr Sabra Heck.  Agree with above plan?

## 2014-08-13 NOTE — Telephone Encounter (Signed)
Agreed.  Thank you.  Ok to close encounter.

## 2014-08-28 ENCOUNTER — Other Ambulatory Visit: Payer: Self-pay | Admitting: Neurology

## 2014-08-28 NOTE — Telephone Encounter (Signed)
Has appt scheduled.

## 2014-08-31 ENCOUNTER — Ambulatory Visit (INDEPENDENT_AMBULATORY_CARE_PROVIDER_SITE_OTHER): Payer: 59 | Admitting: Obstetrics & Gynecology

## 2014-08-31 VITALS — BP 110/62 | HR 64 | Resp 16

## 2014-08-31 DIAGNOSIS — N950 Postmenopausal bleeding: Secondary | ICD-10-CM

## 2014-08-31 DIAGNOSIS — N840 Polyp of corpus uteri: Secondary | ICD-10-CM

## 2014-09-06 ENCOUNTER — Encounter: Payer: Self-pay | Admitting: Obstetrics & Gynecology

## 2014-09-06 DIAGNOSIS — N840 Polyp of corpus uteri: Secondary | ICD-10-CM | POA: Insufficient documentation

## 2014-09-06 NOTE — Progress Notes (Signed)
59 yo G3P3 MWF here for consultation regarding pathology findings obtained at new pt visit on 07/22/14.  Pt referred due to PMP bleeding.  Referral was from Vicenta Aly.  Pap smear was negative day of visit and endometrial biopsy done day of visit showing:  Endometrium, biopsy - BENIGN ENDOMETRIAL POLYP. - NO EVIDENCE OF ATYPIA, HYPERPLASIA OR MALIGNANCY.  Due to bleeding and findings on biopsy of endometrial polyp, hysteroscopy with polyp resection was recommended.  Pt has declined procedure, which is her right to decide, but when communicating with my nursing staff has stated she understands recommendation but feels this is unnecessary as "it has already been determined that there is no cancer".   Feel it is very important for pt to understand endometrial biopsy is very useful test but definitive treatment for polyp and complete pathology analysis can only be done after hysteroscopy with polyp resection.  Biopsy only obtained a piece of the polyp and not the entire polyp therefore I am not 100% sure pathology is benign and cannot be until polyp fully removed.    Pt voices clear understanding of above.  Has some questions about hysteroscopy.  These were answered.  I do not want her to feel I am trying to talk her into this procedure.  It is absolutely her right to decide to proceed or not to proceed but I just want her to know and acknowledge the limitations with an office endometrial biopsy.  Pt voices understanding and desire to still not do anything else.    Pt aware I will be available again for her in future if bleeding recurs.  All questions answered.    Assessment:  PMP bleeding  Endometrial biopsy showing endometrial polyp Pt declines additional evaluation/treatment with hysteroscopy at this time  Plan:  Pt encouraged to call with any future bleeding. Will send copy of note to Vicenta Aly, NP, so she is aware.  ~15 minutes spent with patient >50% of time was in face to face  discussion of above.

## 2014-09-14 ENCOUNTER — Ambulatory Visit (INDEPENDENT_AMBULATORY_CARE_PROVIDER_SITE_OTHER): Payer: 59 | Admitting: Nurse Practitioner

## 2014-09-14 ENCOUNTER — Encounter: Payer: Self-pay | Admitting: Nurse Practitioner

## 2014-09-14 VITALS — BP 119/75 | HR 67 | Ht 65.0 in | Wt 168.0 lb

## 2014-09-14 DIAGNOSIS — G40209 Localization-related (focal) (partial) symptomatic epilepsy and epileptic syndromes with complex partial seizures, not intractable, without status epilepticus: Secondary | ICD-10-CM

## 2014-09-14 MED ORDER — LACOSAMIDE 200 MG PO TABS: ORAL_TABLET | ORAL | Status: DC

## 2014-09-14 MED ORDER — KEPPRA XR 500 MG PO TB24: ORAL_TABLET | ORAL | Status: DC

## 2014-09-14 NOTE — Patient Instructions (Signed)
Continue Vimpat at current dose will refill Continue Keppra at current dose will refill Call for any seizure activity Follow-up yearly and when necessary

## 2014-09-14 NOTE — Progress Notes (Signed)
[facility]  PATIENT: Sara Lawson DOB: [birth year]   REASON FOR VISIT: Follow-up for seizure disorder HISTORY FROM: Patient    HISTORY OF PRESENT ILLNESS:Sara Lawson, 59year-old female returns for followup. She has a history of complex partial seizure disorder. Last seizure was 2/24/ 2014. She was last seen in this office 09/11/13.  Her seizure disorder is controlled with  Vimpat and Keppra.  She denies any side effects to Keppra or Vimpat. She has not had any falls, she is driving without difficulty , needs refills on her medication returns for reevaluation.   HISTORY: She started to have seizure since Jan 2010 after she felt and slammed her head at an ice cooler at gas station, no LOC, after that she started to have the episodes. She has past medical history of hypothyroidism, thyroid cysts, chronic neck pain, headache, presenting with 6 month history of intermittent episodes of confusion. She reported all presentation has similar seminology. She would suddenly lost connection with the reality, feel like " I am not there". never totally lost consciousness, sometimes, it's proceed by smell some strong chemical smells, or exhaustion, stomach knot like sensation, with nausea.  MRI :mild-moderate atrophy, EEG normal. Jun 26 2012 She continues to have recurrent seizures, sometimes multiple episodes in a day, over years, she was changed to Mobile XR, currently taking 500 mg 2 tablets every night, also on titrating dose of Vimpat, she is taking 150 mg twice a day,  Last seizure was in June 2013, in June 24 2012, she woke up having recurrent episode of seizure every 30-60 minutes, for a total of 7 episodes, stomach cramping, overwhelming sensation, lasting for 1 minutes, she took extra dose of 150 mg vimpat, one more recurrent episode afterwards, and then stop, she denies significant side effects from medications     REVIEW OF SYSTEMS: Full 14 system  review of systems performed and notable only for those listed, all others are neg:  Constitutional: neg  Cardiovascular: neg Ear/Nose/Throat: neg  Skin: neg Eyes: neg Respiratory: neg Gastroitestinal: neg  Hematology/Lymphatic: neg  Endocrine: neg Musculoskeletal:neg Allergy/Immunology: neg Neurological: neg Psychiatric: neg Sleep : neg   ALLERGIES: Allergies  Allergen Reactions  . Cefdinir     diarrhea  . Penicillins     REACTION: rash---she is able to take cephalosporins    HOME MEDICATIONS: Outpatient Prescriptions Prior to Visit  Medication Sig Dispense Refill  . estradiol (ESTRACE) 0.1 MG/GM vaginal cream Small amt externally directed every other day 42.5 g 1  . KEPPRA XR 500 MG 24 hr tablet TAKE 2 TABLETS (1,000 MG TOTAL) BY MOUTH NIGHTLY. 60 tablet 0  . lacosamide (VIMPAT) 200 MG TABS tablet TAKE 1 TABLET BY MOUTH TWICE DAILY 60 tablet 5  . SYNTHROID 100 MCG tablet      No facility-administered medications prior to visit.    PAST MEDICAL HISTORY: Past Medical History  Diagnosis Date  . Chronic head pain   . Chronic neck pain   . Migraine     not currently  . Seizure   . Celiac disease     diag 2014 Sara Lawson    PAST SURGICAL HISTORY: Past Surgical History  Procedure Laterality Date  . Appendix removed    . 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 Breast enhancement surgery      FAMILY HISTORY: Family History  Problem Relation Age of Onset  . Colon cancer Mother     had hysterectomy in late 36s-not  sure if due to any problems, ? Celiac disease  . Colon cancer Maternal Uncle     x2  . Hypertension Mother   . Diabetes Mother   . Diabetes Maternal Grandmother   . Diabetes Maternal Uncle     x2  . Diabetes Maternal Uncle   . Stroke Other     maternal great grandfather-diabetes  . Heart disease Mother   . Dementia Mother   . Thyroid disease Other     maternal side  . Diabetes Sister   . Diabetes Brother     type 2     SOCIAL HISTORY: History   Social History  . Marital Status: Married    Spouse Name: Sara Lawson  . Number of Children: 3  . Years of Education: N/A   Occupational History  . Not on file.   Social History Main Topics  . Smoking status: Current Every Day Smoker -- 0.25 packs/day    Types: Cigarettes  . Smokeless tobacco: Never Used  . Alcohol Use: 0.0 oz/week    0 Standard drinks or equivalent per week     Comment: occ  . Drug Use: No  . Sexual Activity: No   Other Topics Concern  . Not on file   Social History Narrative   Patient lives at home with her husband Sara Lawson and been married for 10 years.    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   Patient has 3 children.   Patient has some college education.   Patient drinks 6 cups of coffee daily.            PHYSICAL EXAM  Filed Vitals:   09/14/14 1510  BP: 119/75  Pulse: 67  Height: 5\' 5"  (1.651 m)  Weight: 168 lb (76.204 kg)   Body mass index is 27.96 kg/(m^2). Generalized: Well developed, in no acute distress  Head: normocephalic and atraumatic,. Oropharynx benign  Neck: Supple, no carotid bruits  Musculoskeletal: No deformity   Neurological examination   Mentation: Alert oriented to time, place, history taking. Follows all commands speech and language fluent  Cranial nerve II-XII: Pupils were equal round reactive to light extraocular movements were full, visual field were full on confrontational test. Facial sensation and strength were normal. hearing was intact to finger rubbing bilaterally. Uvula tongue midline. head turning and shoulder shrug were normal and symmetric.Tongue protrusion into cheek strength was normal. Motor: normal bulk and tone, full strength in the BUE, BLE, No focal weakness Coordination: finger-nose-finger, heel-to-shin bilaterally, no dysmetria Reflexes: Brachioradialis 2/2, biceps 2/2, triceps 2/2, patellar 2/2, Achilles 2/2, plantar responses were flexor bilaterally. Gait and  Station: Rising up from seated position without assistance, normal stance, moderate stride, good arm swing, smooth turning, able to perform tiptoe, and heel walking without difficulty. Tandem gait is steady   DIAGNOSTIC DATA (LABS, IMAGING, TESTING) - ASSESSMENT AND PLAN  59 y.o. year old female  has a past medical history of complex partial seizure disorder with last seizure occurring in February 2014. She is currently well-controlled on Keppra and Vimpat without side effects. Continue Vimpat at current dose will refill Continue Keppra at current dose will refill Call for any seizure activity Follow-up yearly and when necessary Dennie Bible, [facility], [facility], [facility] [address] [place], [place] [address] [phone]

## 2014-09-16 ENCOUNTER — Other Ambulatory Visit (HOSPITAL_COMMUNITY): Payer: Self-pay | Admitting: Nurse Practitioner

## 2014-09-16 DIAGNOSIS — Z1231 Encounter for screening mammogram for malignant neoplasm of breast: Secondary | ICD-10-CM

## 2014-09-16 NOTE — Progress Notes (Signed)
I have reviewed and agreed above plan. 

## 2014-09-29 ENCOUNTER — Ambulatory Visit (HOSPITAL_COMMUNITY)
Admission: RE | Admit: 2014-09-29 | Discharge: 2014-09-29 | Disposition: A | Discharge status: Home / Self Care | Payer: 59 | Source: Ambulatory Visit | Attending: Nurse Practitioner | Admitting: Nurse Practitioner

## 2014-09-29 ENCOUNTER — Other Ambulatory Visit (HOSPITAL_COMMUNITY): Payer: Self-pay | Admitting: Nurse Practitioner

## 2014-09-29 DIAGNOSIS — Z1231 Encounter for screening mammogram for malignant neoplasm of breast: Secondary | ICD-10-CM | POA: Diagnosis not present

## 2014-10-01 ENCOUNTER — Encounter: Payer: Self-pay | Admitting: Internal Medicine

## 2015-04-05 ENCOUNTER — Telehealth: Payer: Self-pay

## 2015-04-05 ENCOUNTER — Other Ambulatory Visit: Payer: Self-pay

## 2015-04-05 MED ORDER — LACOSAMIDE 200 MG PO TABS: ORAL_TABLET | ORAL | Status: DC

## 2015-04-05 NOTE — Telephone Encounter (Signed)
[facility]) has approved the request for continuation of coverage on Vimpat effective until 04/04/2016 Ref # [id number]

## 2015-04-05 NOTE — Telephone Encounter (Signed)
Rx signed and faxed.

## 2015-04-05 NOTE — Telephone Encounter (Signed)
[facility]) has approved the request for continuation of coverage on Keppra XR effective until 04/04/2016 Ref # [id number]

## 2015-08-31 ENCOUNTER — Telehealth: Payer: Self-pay | Admitting: Nurse Practitioner

## 2015-08-31 NOTE — Telephone Encounter (Signed)
I spoke to pharmacist and pt picked up both refills (keppra and vimpat).  She has appt 09-15-2015 for follow up.

## 2015-08-31 NOTE — Telephone Encounter (Signed)
Patient is calling and would like a new Rx for the following 2 medications sent to [id number]:  Keppra XR 500 mg 24 hr tablets.  lacosamide 200 mg TABS.  Thanks!

## 2015-09-15 ENCOUNTER — Ambulatory Visit: Payer: 59 | Admitting: Nurse Practitioner

## 2015-09-16 ENCOUNTER — Encounter: Payer: Self-pay | Admitting: Nurse Practitioner

## 2015-09-21 ENCOUNTER — Other Ambulatory Visit: Payer: Self-pay | Admitting: Neurology

## 2015-09-21 MED ORDER — KEPPRA XR 500 MG PO TB24: ORAL_TABLET | ORAL | Status: DC

## 2015-09-21 MED ORDER — LACOSAMIDE 200 MG PO TABS: ORAL_TABLET | ORAL | Status: DC

## 2015-09-21 NOTE — Telephone Encounter (Signed)
Keppra e-scribed to pharmacy. Vimpat printed and will give to [facility] for signature from Dr. Krista Blue.

## 2015-09-21 NOTE — Telephone Encounter (Signed)
Vimpat signed, faxed and confirmed to pharmacy at [phone].

## 2015-09-21 NOTE — Telephone Encounter (Signed)
Patient requesting refill of KEPPRA XR 500 MG 24 hr tablet and lacosamide (VIMPAT) 200 MG TABS tablet Pharmacy. Kristopher Oppenheim, [place]

## 2016-05-04 ENCOUNTER — Other Ambulatory Visit: Payer: Self-pay | Admitting: Nurse Practitioner

## 2016-05-05 ENCOUNTER — Other Ambulatory Visit: Payer: Self-pay | Admitting: Nurse Practitioner

## 2016-05-05 MED ORDER — LACOSAMIDE 200 MG PO TABS: ORAL_TABLET | ORAL | 1 refills | Status: DC

## 2016-05-05 NOTE — Telephone Encounter (Signed)
Fax confirmation received to [facility] [phone]. ssy

## 2016-05-05 NOTE — Addendum Note (Signed)
Addended byOliver Hum on: 05/05/2016 11:46 AM   Modules accepted: Orders

## 2016-05-05 NOTE — Telephone Encounter (Signed)
Patient called requesting refill for lacosamide (VIMPAT) 200 MG TABS tablet. Patient has a fu visit on 06/02/16.

## 2016-06-02 ENCOUNTER — Ambulatory Visit (INDEPENDENT_AMBULATORY_CARE_PROVIDER_SITE_OTHER): Payer: 59 | Admitting: Nurse Practitioner

## 2016-06-02 ENCOUNTER — Encounter: Payer: Self-pay | Admitting: Nurse Practitioner

## 2016-06-02 VITALS — BP 113/75 | HR 70 | Ht 65.0 in | Wt 164.8 lb

## 2016-06-02 DIAGNOSIS — G40209 Localization-related (focal) (partial) symptomatic epilepsy and epileptic syndromes with complex partial seizures, not intractable, without status epilepticus: Secondary | ICD-10-CM

## 2016-06-02 DIAGNOSIS — G8929 Other chronic pain: Secondary | ICD-10-CM | POA: Diagnosis not present

## 2016-06-02 DIAGNOSIS — M545 Low back pain: Secondary | ICD-10-CM

## 2016-06-02 DIAGNOSIS — M549 Dorsalgia, unspecified: Secondary | ICD-10-CM | POA: Insufficient documentation

## 2016-06-02 MED ORDER — KEPPRA XR 500 MG PO TB24: ORAL_TABLET | ORAL | 11 refills | Status: DC

## 2016-06-02 MED ORDER — LACOSAMIDE 200 MG PO TABS: ORAL_TABLET | ORAL | 5 refills | Status: DC

## 2016-06-02 NOTE — Progress Notes (Signed)
[facility]  PATIENT: Sara Lawson DOB: [birth year]   REASON FOR VISIT: Follow-up for seizure disorder HISTORY FROM: Patient    HISTORY OF PRESENT ILLNESS:UPDATE 06/02/16 CMMs.Sara Lawson, 61year-old female returns for followup. She has a history of complex partial seizure disorder. Last seizure was 2/24/ 2014.  Her seizure disorder is controlled with  Vimpat and Keppra.  She denies any side effects to Keppra or Vimpat. She has not had any falls, she is driving without difficulty , needs refills on her medication returns for reevaluation. She also has a history of back pain and is seeing a chiropractor with good results. She does not exercise on a routine basis   HISTORY: She started to have seizure since Jan 2010 after she felt and slammed her head at an ice cooler at gas station, no LOC, after that she started to have the episodes. She has past medical history of hypothyroidism, thyroid cysts, chronic neck pain, headache, presenting with 6 month history of intermittent episodes of confusion. She reported all presentation has similar seminology. She would suddenly lost connection with the reality, feel like " I am not there". never totally lost consciousness, sometimes, it's proceed by smell some strong chemical smells, or exhaustion, stomach knot like sensation, with nausea. MRI :mild-moderate atrophy, EEG normal. Jun 26 2012 She continues to have recurrent seizures, sometimes multiple episodes in a day, over years, she was changed to [place] XR, currently taking 500 mg 2 tablets every night, also on titrating dose of Vimpat, she is taking 150 mg twice a day,  Last seizure was in June 2013, in June 24 2012, she woke up having recurrent episode of seizure every 30-60 minutes, for a total of 7 episodes, stomach cramping, overwhelming sensation, lasting for 1 minutes, she took extra dose of 150 mg vimpat, one more recurrent episode afterwards, and then stop, she  denies significant side effects from medications     REVIEW OF SYSTEMS: Full 14 system review of systems performed and notable only for those listed, all others are neg:  Constitutional: neg  Cardiovascular: neg Ear/Nose/Throat: neg  Skin: neg Eyes: neg Respiratory: neg Gastroitestinal: neg  Hematology/Lymphatic: neg  Endocrine: neg Musculoskeletal: Back pain Allergy/Immunology: neg Neurological: Seizure disorder Psychiatric: neg Sleep : neg   ALLERGIES: Allergies  Allergen Reactions  . Cefdinir     diarrhea  . Penicillins     REACTION: rash---she is able to take cephalosporins    HOME MEDICATIONS: Outpatient Medications Prior to Visit  Medication Sig Dispense Refill  . KEPPRA XR 500 MG 24 hr tablet TAKE 2 TABLETS (1,000 MG TOTAL) BY MOUTH NIGHTLY. 60 tablet 11  . lacosamide (VIMPAT) 200 MG TABS tablet TAKE 1 TABLET BY MOUTH TWICE DAILY 60 tablet 1  . SYNTHROID 100 MCG tablet     . estradiol (ESTRACE) 0.1 MG/GM vaginal cream Small amt externally directed every other day (Patient not taking: Reported on 06/02/2016) 42.5 g 1   No facility-administered medications prior to visit.     PAST MEDICAL HISTORY: Past Medical History:  Diagnosis Date  . Celiac disease    diag 2014 Dr Collene Mares  . Chronic head pain   . Chronic neck pain   . Migraine    not currently  . Seizure ([place])     PAST SURGICAL HISTORY: Past Surgical History:  Procedure Laterality Date  . appendix removed    . BREAST ENHANCEMENT SURGERY    . 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 Colon cancer Mother     had hysterectomy in late 30s-not sure if due to any problems, ? Celiac disease  . Hypertension Mother   . Diabetes Mother   . Heart disease Mother   . Dementia Mother   . Colon cancer Maternal Uncle     x2  . Diabetes Maternal Grandmother   . Diabetes Maternal Uncle     x2  . Diabetes Maternal Uncle   .  Stroke Other     maternal great grandfather-diabetes  . Thyroid disease Other     maternal side  . Diabetes Sister   . Diabetes Brother     type 2    SOCIAL HISTORY: Social History   Social History  . Marital status: Married    Spouse name: Sara Lawson  . Number of children: 3  . Years of education: N/A   Occupational History  . Not on file.   Social History Main Topics  . Smoking status: Current Every Day Smoker    Packs/day: 0.25    Types: Cigarettes  . Smokeless tobacco: Never Used  . Alcohol use 0.0 oz/week     Comment: occ  . Drug use: No  . Sexual activity: No   Other Topics Concern  . Not on file   Social History Narrative   Patient lives at home with her husband Sara Lawson and been married for 10 years.    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   Patient has 3 children.   Patient has some college education.   Patient drinks 6 cups of coffee daily.            PHYSICAL EXAM  Vitals:   06/02/16 0807  BP: 113/75  Pulse: 70  Weight: 164 lb 12.8 oz (74.8 kg)  Height: 5\' 5"  (1.651 m)   Body mass index is 27.42 kg/m. Generalized: Well developed, in no acute distress  Head: normocephalic and atraumatic,. Oropharynx benign  Musculoskeletal: No deformity   Neurological examination   Mentation: Alert oriented to time, place, history taking. Follows all commands speech and language fluent  Cranial nerve II-XII: Pupils were equal round reactive to light extraocular movements were full, visual field were full on confrontational test. Facial sensation and strength were normal. hearing was intact to finger rubbing bilaterally. Uvula tongue midline. head turning and shoulder shrug were normal and symmetric.Tongue protrusion into cheek strength was normal. Motor: normal bulk and tone, full strength in the BUE, BLE, No focal weakness Coordination: finger-nose-finger, heel-to-shin bilaterally, no dysmetria Reflexes: Symmetric upper and lower, plantar responses were  flexor bilaterally. Gait and Station: Rising up from seated position without assistance, normal stance, moderate stride, good arm swing, smooth turning, able to perform tiptoe, and heel walking without difficulty. Tandem gait is steady. No assistive device   DIAGNOSTIC DATA (LABS, IMAGING, TESTING) - ASSESSMENT AND PLAN  61 y.o. year old female  has a past medical history of complex partial seizure disorder with last seizure occurring in February 2014. She is currently well-controlled on Keppra and Vimpat without side effects. She also has back pain and is currently seeing a chiropractor.The patient is a current patient of Dr. Krista Blue  who is out of the office today . This note is sent to the work in doctor.      Continue Vimpat at current dose will refill Continue Keppra at current dose will refill Call for any seizure activity Follow-up yearly and  when necessary I spent 15 min  in total face to face time with the patient more than 50% of which was spent counseling and coordination of care, reviewing test results reviewing medications and discussing and reviewing the diagnosis of seizure disorder and back pain which is a new complaint for her Made  aware with back pain it  is important to stay active do not sit or stand in one place for long periods of  Time, try to walk every day on a even surface, use proper body mechanics, healthy weight , excess weight puts  Pressure on the back. She currently has a list of back  exercises but only performs these when she is hurting.  Dennie Bible, [facility], [facility], APRN  [facility] [address], [place], [place] [address] [phone]

## 2016-06-02 NOTE — Progress Notes (Signed)
Fax confirmation received HT vimpat [phone]. sy

## 2016-06-02 NOTE — Patient Instructions (Signed)
Continue Keppra at current dose will refill Continue Vimpat at current dose will refill Call for any seizure activity  follow-up yearly and when necessary

## 2016-06-02 NOTE — Progress Notes (Signed)
I agree with the above plan 

## 2016-06-26 ENCOUNTER — Telehealth: Payer: Self-pay | Admitting: Nurse Practitioner

## 2016-06-26 DIAGNOSIS — H5789 Other specified disorders of eye and adnexa: Secondary | ICD-10-CM

## 2016-06-26 NOTE — Telephone Encounter (Signed)
Patient called office in reference to increased bilateral eye burning (left eye worse).  Patient went to see an eye Dr.  last Thursday with everything being clear.  Patient has read side effects of Keppa states burning eyes, she is possibly wondering if this could be causing the burning eyes.  Please call

## 2016-06-26 NOTE — Telephone Encounter (Signed)
Discussed with Dr. Krista Blue. Get lab test ANA with reflex. She does not think this is Keppra either

## 2016-06-26 NOTE — Telephone Encounter (Signed)
In review in UP TO DATE there is no listing  Of eye problems and I have never had a pt complain with that symptom. Marland Kitchen

## 2016-06-26 NOTE — Addendum Note (Signed)
Addended by: Evlyn Courier on: 06/26/2016 05:25 PM   Modules accepted: Orders

## 2016-06-26 NOTE — Telephone Encounter (Signed)
Pt has been to 3 opthamologist for her burning eyes.  She has been on three drops since around dec 2017.  She took noninflammatory gtt which helped but eyes restarted with burning.   She does not a twitching. Eye blinking episodes that last about 30 min then burning resumes.  (this comes and goes).  ? Seizures.  The last opthamologist , Dr. Claudean Kinds placed her on fluoremeprolone (which are not helping) and will recheck in her about 2 wks.  ? meds she is on.  She has been on brand name keppra and vimpat for yrs.  Make appt to evaluate?

## 2016-06-27 ENCOUNTER — Other Ambulatory Visit: Payer: Self-pay | Admitting: *Deleted

## 2016-06-27 ENCOUNTER — Other Ambulatory Visit (INDEPENDENT_AMBULATORY_CARE_PROVIDER_SITE_OTHER): Payer: Self-pay

## 2016-06-27 DIAGNOSIS — H5789 Other specified disorders of eye and adnexa: Secondary | ICD-10-CM

## 2016-06-27 DIAGNOSIS — Z0289 Encounter for other administrative examinations: Secondary | ICD-10-CM

## 2016-06-27 NOTE — Telephone Encounter (Signed)
I spoke to pt and relayed that Dr. Krista Blue was consulted and she did not think that the keppra was causing this.  Want to do some blood work, ANA with reflex. Will come in today to have done.  (no copay needed, [facility]).

## 2016-06-28 ENCOUNTER — Telehealth: Payer: Self-pay | Admitting: Nurse Practitioner

## 2016-06-28 LAB — ANA W/REFLEX: Anti Nuclear Antibody(ANA): NEGATIVE

## 2016-06-28 NOTE — Telephone Encounter (Signed)
Spoke with pt and relayed that the lab test done for (the inflammation she was having in her eyes) was negative (test for autoimmune diseases.  She will decrease keppra to 1 tablet (500mg  ) to once daily for a month then stop after that.   Will call if sz activity.  Will continue vimpat 200mg  po bid.  She verbalized understanding of these directions.

## 2016-06-28 NOTE — Telephone Encounter (Signed)
ANA was negative. Discussed with Dr. Krista Blue. Continue Vimpat 200 mg twice daily. Decrease Keppra to 1 tablet at night for one month and then discontinue medication. Call for any seizure activity. Last documented seizure 2014 Please call the patient

## 2016-10-04 ENCOUNTER — Other Ambulatory Visit: Payer: Self-pay | Admitting: Nurse Practitioner

## 2016-10-04 DIAGNOSIS — Z1231 Encounter for screening mammogram for malignant neoplasm of breast: Secondary | ICD-10-CM

## 2016-12-15 ENCOUNTER — Other Ambulatory Visit: Payer: Self-pay | Admitting: Nurse Practitioner

## 2016-12-15 ENCOUNTER — Telehealth: Payer: Self-pay | Admitting: Neurology

## 2016-12-15 MED ORDER — LACOSAMIDE 200 MG PO TABS: ORAL_TABLET | ORAL | 5 refills | Status: DC

## 2016-12-15 NOTE — Telephone Encounter (Signed)
Please send refill to  [facility] [address] [place] [phone] (Phone) [phone] (Fax)

## 2016-12-15 NOTE — Addendum Note (Signed)
Addended by: Brandon Melnick on: 12/15/2016 09:54 AM   Modules accepted: Orders

## 2016-12-15 NOTE — Telephone Encounter (Signed)
Pt calling for refill of lacosamide (VIMPAT) 200 MG TABS tablet

## 2016-12-18 NOTE — Telephone Encounter (Signed)
Faxed printed/signed rx vimpat to pharmacy. Fax failedx2. Called and spoke with pharmacy Sonia Baller). She verified they have been receiving faxes. No need to fax any more. Nothing further needed.

## 2017-02-02 NOTE — Telephone Encounter (Signed)
ERROR

## 2017-05-31 NOTE — Progress Notes (Signed)
[facility]  PATIENT: Sara Lawson DOB: [birth year]   REASON FOR VISIT: Follow-up for seizure disorder HISTORY FROM: Patient    HISTORY OF PRESENT ILLNESS:UPDATE 2/4/2019CM Ms.Mecum, 62 year old female returns for follow-up with history of complex partial seizure disorder.  Her seizure disorder is well controlled with Vimpat.  She was on Keppra at one time but titrated of the medication due to significant side effects with her eyes.  She has been off of the medication since the end of March 2018.  She is still continues to complain of light sensitivity especially when outside with her eyes she also states that [place] made her sluggish all the time however this is after the  Fact.  Patient also states her headaches are completely gone.  She also reported  twitching always on the right side of the face starting with down to the right side of her mouth.  This completely stopped since fall of last year. No twitching is seen on exam today.  ANA with reflex was negative which tests for autoimmune diseases.  She brings in a 3 page synopsis of all side effects that she experienced from [place] this will be scanned to her medical record.  She returns for reevaluation     UPDATE 06/02/16 CMMs.Burkley, 62year-old female returns for followup. She has a history of complex partial seizure disorder. Last seizure was 2/24/ 2014.  Her seizure disorder is controlled with  Vimpat and Keppra.  She denies any side effects to Keppra or Vimpat. She has not had any falls, she is driving without difficulty , needs refills on her medication returns for reevaluation. She also has a history of back pain and is seeing a chiropractor with good results. She does not exercise on a routine basis   HISTORY: She started to have seizure since Jan 2010 after she felt and slammed her head at an ice cooler at gas station, no LOC, after that she started to have the episodes. She has past medical history of  hypothyroidism, thyroid cysts, chronic neck pain, headache, presenting with 6 month history of intermittent episodes of confusion. She reported all presentation has similar seminology. She would suddenly lost connection with the reality, feel like " I am not there". never totally lost consciousness, sometimes, it's proceed by smell some strong chemical smells, or exhaustion, stomach knot like sensation, with nausea. MRI :mild-moderate atrophy, EEG normal. Jun 26 2012 She continues to have recurrent seizures, sometimes multiple episodes in a day, over years, she was changed to [place] XR, currently taking 500 mg 2 tablets every night, also on titrating dose of Vimpat, she is taking 150 mg twice a day,  Last seizure was in June 2013, in June 24 2012, she woke up having recurrent episode of seizure every 30-60 minutes, for a total of 7 episodes, stomach cramping, overwhelming sensation, lasting for 1 minutes, she took extra dose of 150 mg vimpat, one more recurrent episode afterwards, and then stop, she denies significant side effects from medications     REVIEW OF SYSTEMS: Full 14 system review of systems performed and notable only for those listed, all others are neg:  Constitutional: neg  Cardiovascular: neg Ear/Nose/Throat: Light sensitivity Skin: neg Eyes: neg Respiratory: neg Gastroitestinal: neg  Hematology/Lymphatic: neg  Endocrine: neg Musculoskeletal: Back pain Allergy/Immunology: Food allergies Neurological: Seizure disorder Psychiatric: neg Sleep : neg   ALLERGIES: Allergies  Allergen Reactions  . Cefdinir     diarrhea  . Keppra [Levetiracetam]     Burning in  eyes  . Penicillins     REACTION: rash---she is able to take cephalosporins    HOME MEDICATIONS: Outpatient Medications Prior to Visit  Medication Sig Dispense Refill  . cholecalciferol (VITAMIN D) 1000 units tablet Take 2,000 Units by mouth daily.    Marland Kitchen lacosamide (VIMPAT) 200 MG TABS tablet TAKE  1 TABLET BY MOUTH TWICE DAILY 60 tablet 5  . Multiple Vitamins-Minerals (CENTRUM SILVER 50+WOMEN) TABS Take by mouth. One daily    . Omega-3 Fatty Acids (FISH OIL) 1000 MG CAPS Take by mouth. One daily    . SYNTHROID 100 MCG tablet     . vitamin E (VITAMIN E) 200 UNIT capsule Take 200 Units by mouth daily.    Marland Kitchen KEPPRA XR 500 MG 24 hr tablet TAKE 2 TABLETS (1,000 MG TOTAL) BY MOUTH NIGHTLY. (Patient not taking: Reported on 06/04/2017) 60 tablet 11   No facility-administered medications prior to visit.     PAST MEDICAL HISTORY: Past Medical History:  Diagnosis Date  . Celiac disease    diag 2014 Dr Collene Mares  . Chronic head pain   . Chronic neck pain   . Migraine    not currently  . Seizure ([place])     PAST SURGICAL HISTORY: Past Surgical History:  Procedure Laterality Date  . appendix removed    . BREAST ENHANCEMENT SURGERY    . 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 Colon cancer Mother        had hysterectomy in late 30s-not sure if due to any problems, ? Celiac disease  . Hypertension Mother   . Diabetes Mother   . Heart disease Mother   . Dementia Mother   . Colon cancer Maternal Uncle        x2  . Diabetes Maternal Grandmother   . Diabetes Maternal Uncle        x2  . Diabetes Maternal Uncle   . Stroke Other        maternal great grandfather-diabetes  . Thyroid disease Other        maternal side  . Diabetes Sister   . Diabetes Brother        type 2    SOCIAL HISTORY: Social History   Socioeconomic History  . Marital status: Married    Spouse name: Mikki Santee  . Number of children: 3  . Years of education: Not on file  . Highest education level: Not on file  Social Needs  . Financial resource strain: Not on file  . Food insecurity - worry: Not on file  . Food insecurity - inability: Not on file  . Transportation needs - medical: Not on file  . Transportation needs - non-medical: Not  on file  Occupational History  . Not on file  Tobacco Use  . Smoking status: Current Every Day Smoker    Packs/day: 0.25    Types: Cigarettes  . Smokeless tobacco: Never Used  Substance and Sexual Activity  . Alcohol use: Yes    Alcohol/week: 0.0 oz    Comment: occ  . Drug use: No  . Sexual activity: No    Partners: Male  Other Topics Concern  . Not on file  Social History Narrative   Patient lives at home with her husband Mikki Santee and been married for 10 years.    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   Patient has 3 children.  Patient has some college education.   Patient drinks 6 cups of coffee daily.         PHYSICAL EXAM  Vitals:   06/04/17 0726  BP: 132/74  Pulse: 72  Weight: 169 lb 6.4 oz (76.8 kg)  Height: 5\' 5"  (1.651 m)   Body mass index is 28.19 kg/m. Generalized: Well developed, in no acute distress  Head: normocephalic and atraumatic,. Oropharynx benign  Musculoskeletal: No deformity   Neurological examination   Mentation: Alert oriented to time, place, history taking. Follows all commands speech and language fluent  Cranial nerve II-XII: Pupils were equal round reactive to light extraocular movements were full, visual field were full on confrontational test. Facial sensation and strength were normal. hearing was intact to finger rubbing bilaterally. Uvula tongue midline. head turning and shoulder shrug were normal and symmetric.Tongue protrusion into cheek strength was normal. Motor: normal bulk and tone, full strength in the BUE, BLE, No focal weakness Coordination: finger-nose-finger, heel-to-shin bilaterally, no dysmetria Reflexes: Symmetric upper and lower, plantar responses were flexor bilaterally. Gait and Station: Rising up from seated position without assistance, normal stance, moderate stride, good arm swing, smooth turning, able to perform tiptoe, and heel walking without difficulty. Tandem gait is steady. No assistive  device   DIAGNOSTIC DATA (LABS, IMAGING, TESTING) - ASSESSMENT AND PLAN  62 y.o. year old female  has a past medical history of complex partial seizure disorder with last seizure occurring in February 2014. She is currently well-controlled on  Vimpat without side effects.       Continue Vimpat at current dose will refill Call for any seizure activity Follow-up yearly and when necessary I spent 15 min  in total face to face time with the patient more than 50% of which was spent counseling and coordination of care, reviewing test results reviewing medications and discussing and reviewing the diagnosis of seizure disorder. Dennie Bible, [facility], [facility], APRN  [facility] [address] [place], [place] [address] [phone]

## 2017-06-04 ENCOUNTER — Encounter (INDEPENDENT_AMBULATORY_CARE_PROVIDER_SITE_OTHER): Payer: Self-pay

## 2017-06-04 ENCOUNTER — Ambulatory Visit: Payer: 59 | Admitting: Nurse Practitioner

## 2017-06-04 ENCOUNTER — Encounter: Payer: Self-pay | Admitting: Nurse Practitioner

## 2017-06-04 VITALS — BP 132/74 | HR 72 | Ht 65.0 in | Wt 169.4 lb

## 2017-06-04 DIAGNOSIS — G40209 Localization-related (focal) (partial) symptomatic epilepsy and epileptic syndromes with complex partial seizures, not intractable, without status epilepticus: Secondary | ICD-10-CM | POA: Diagnosis not present

## 2017-06-04 MED ORDER — LACOSAMIDE 200 MG PO TABS: ORAL_TABLET | ORAL | 5 refills | Status: DC

## 2017-06-04 NOTE — Patient Instructions (Signed)
Continue Vimpat at current dose will refill Call for any seizure activity Follow-up yearly and when necessary

## 2017-06-04 NOTE — Progress Notes (Signed)
Fax confirmation received Vimpat HT [phone].

## 2017-06-07 NOTE — Progress Notes (Signed)
I have reviewed and agreed above plan. 

## 2017-12-26 ENCOUNTER — Other Ambulatory Visit: Payer: Self-pay | Admitting: Neurology

## 2018-05-06 ENCOUNTER — Telehealth: Payer: Self-pay | Admitting: *Deleted

## 2018-05-06 NOTE — Telephone Encounter (Signed)
Started Vimpat PA on CMM, key alwwarlh.  Patient has failed Keppra- caused burning in eyes. Information was sent to [facility].

## 2018-05-06 NOTE — Telephone Encounter (Signed)
Fax received from [facility], phone# [phone]. Vimpat approved thru 05/07/19. Pt. ID# [id number]. Ref# [id number]

## 2018-05-06 NOTE — Telephone Encounter (Signed)
Received approval for Vimpat. PA # [id number], effective through 05/07/2019.

## 2018-05-10 ENCOUNTER — Other Ambulatory Visit: Payer: Self-pay | Admitting: Nurse Practitioner

## 2018-05-10 DIAGNOSIS — Z1231 Encounter for screening mammogram for malignant neoplasm of breast: Secondary | ICD-10-CM

## 2018-06-04 ENCOUNTER — Ambulatory Visit: Payer: 59 | Admitting: Nurse Practitioner

## 2018-06-07 ENCOUNTER — Ambulatory Visit
Admission: RE | Admit: 2018-06-07 | Discharge: 2018-06-07 | Disposition: A | Discharge status: Home / Self Care | Payer: 59 | Source: Ambulatory Visit | Attending: Nurse Practitioner | Admitting: Nurse Practitioner

## 2018-06-07 DIAGNOSIS — Z1231 Encounter for screening mammogram for malignant neoplasm of breast: Secondary | ICD-10-CM

## 2018-06-17 ENCOUNTER — Ambulatory Visit: Payer: 59 | Admitting: Neurology

## 2018-06-17 ENCOUNTER — Encounter: Payer: Self-pay | Admitting: Neurology

## 2018-06-17 VITALS — BP 148/92 | HR 85 | Ht 65.0 in | Wt 177.0 lb

## 2018-06-17 DIAGNOSIS — G40209 Localization-related (focal) (partial) symptomatic epilepsy and epileptic syndromes with complex partial seizures, not intractable, without status epilepticus: Secondary | ICD-10-CM | POA: Diagnosis not present

## 2018-06-17 MED ORDER — LACOSAMIDE 200 MG PO TABS: ORAL_TABLET | ORAL | 3 refills | Status: DC

## 2018-06-17 NOTE — Progress Notes (Signed)
[facility]  PATIENT: Sara Lawson DOB: [birth year]   HISTORY OF PRESENT ILLNESS:  HISTORY: She started to have seizure since Jan 2010 after she fell and slammed her head at an ice cooler at gas station, no LOC, after that she started to have the episodes.  She has past medical history of hypothyroidism, thyroid cysts, chronic neck pain, headache, presenting with 6 month history of intermittent episodes of confusion.  She reported all presentation has similar seminology. She would suddenly lost connection with the reality, feel like " I am not there". never totally lost consciousness, sometimes, it's proceed by smell some strong chemical smells, or exhaustion, stomach knot like sensation, with nausea. MRI :mild-moderate atrophy, EEG normal.  Jun 26 2012 She continues to have recurrent seizures, sometimes multiple episodes in a day, over years, she was changed to [place] XR, currently taking 500 mg 2 tablets every night, also on titrating dose of Vimpat, she is taking 150 mg twice a day,   Last seizure was in June 2013, in June 24 2012, she woke up having recurrent episode of seizure every 30-60 minutes, for a total of 7 episodes, stomach cramping, overwhelming sensation, lasting for 1 minutes, she took extra dose of 150 mg vimpat, one more recurrent episode afterwards, and then stop, she denies significant side effects from medications   UPDATE Jun 17 2018: She cannot tolerate Keppra due to significant side effect,  was switched to Vimpat since March 2018, now taking 200 mg twice a day, tolerating it well, there was no longer recurrent episode,  REVIEW OF SYSTEMS: Full 14 system review of systems performed and notable only for those listed, all others are neg:  Eye itching, light sensitivity, food allergy, urgency, snoring, rash, headache, seizure, decreased concentration  ALLERGIES: Allergies  Allergen Reactions  . Cefdinir     diarrhea  .  Keppra [Levetiracetam]     Burning in eyes  . Penicillins     REACTION: rash---she is able to take cephalosporins    HOME MEDICATIONS: Outpatient Medications Prior to Visit  Medication Sig Dispense Refill  . cholecalciferol (VITAMIN D) 1000 units tablet Take 2,000 Units by mouth daily.    Marland Kitchen lacosamide (VIMPAT) 200 MG TABS tablet TAKE ONE TABLET BY MOUTH TWO TIMES A DAY 60 tablet 5  . MAGNESIUM PO Take 1 tablet by mouth daily.    . Multiple Vitamins-Minerals (CENTRUM SILVER 50+WOMEN) TABS Take by mouth. One daily    . Omega-3 Fatty Acids (FISH OIL) 1000 MG CAPS Take by mouth. One daily    . SYNTHROID 100 MCG tablet     . vitamin E (VITAMIN E) 200 UNIT capsule Take 200 Units by mouth daily.     No facility-administered medications prior to visit.     PAST MEDICAL HISTORY: Past Medical History:  Diagnosis Date  . Celiac disease    diag 2014 Dr Collene Mares  . Chronic head pain   . Chronic neck pain   . Migraine    not currently  . Seizure ([place])     PAST SURGICAL HISTORY: Past Surgical History:  Procedure Laterality Date  . appendix removed    . AUGMENTATION MAMMAPLASTY Bilateral 2002  . BREAST ENHANCEMENT SURGERY    . 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 Colon cancer Mother        had hysterectomy in late 30s-not sure if due to  any problems, ? Celiac disease  . Hypertension Mother   . Diabetes Mother   . Heart disease Mother   . Dementia Mother   . Colon cancer Maternal Uncle        x2  . Diabetes Maternal Grandmother   . Diabetes Maternal Uncle        x2  . Diabetes Maternal Uncle   . Stroke Other        maternal great grandfather-diabetes  . Thyroid disease Other        maternal side  . Diabetes Sister   . Diabetes Brother        type 2    SOCIAL HISTORY: Social History   Socioeconomic History  . Marital status: Married    Spouse name: Mikki Santee  . Number of children: 3  . Years  of education: Not on file  . Highest education level: Not on file  Occupational History  . Not on file  Social Needs  . Financial resource strain: Not on file  . Food insecurity:    Worry: Not on file    Inability: Not on file  . Transportation needs:    Medical: Not on file    Non-medical: Not on file  Tobacco Use  . Smoking status: Current Every Day Smoker    Packs/day: 0.25    Types: Cigarettes  . Smokeless tobacco: Never Used  Substance and Sexual Activity  . Alcohol use: Yes    Alcohol/week: 0.0 standard drinks    Comment: occ  . Drug use: No  . Sexual activity: Never    Partners: Male  Lifestyle  . Physical activity:    Days per week: Not on file    Minutes per session: Not on file  . Stress: Not on file  Relationships  . Social connections:    Talks on phone: Not on file    Gets together: Not on file    Attends religious service: Not on file    Active member of club or organization: Not on file    Attends meetings of clubs or organizations: Not on file    Relationship status: Not on file  . Intimate partner violence:    Fear of current or ex partner: Not on file    Emotionally abused: Not on file    Physically abused: Not on file    Forced sexual activity: Not on file  Other Topics Concern  . Not on file  Social History Narrative   Patient lives at home with her husband Mikki Santee and been married for 10 years.    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   Patient has 3 children.   Patient has some college education.   Patient drinks 6 cups of coffee daily.         PHYSICAL EXAM  Vitals:   06/17/18 1419  BP: (!) 148/92  Pulse: 85  Weight: 177 lb (80.3 kg)  Height: 5\' 5"  (1.651 m)   Body mass index is 29.45 kg/m. Generalized: Well developed, in no acute distress  Head: normocephalic and atraumatic,. Oropharynx benign  Musculoskeletal: No deformity   Neurological examination   Mentation: Alert oriented to time, place, history taking. Follows  all commands speech and language fluent  Cranial nerve II-XII: Pupils were equal round reactive to light extraocular movements were full, visual field were full on confrontational test. Facial sensation and strength were normal. hearing was intact to finger rubbing bilaterally. Uvula tongue midline. head turning and shoulder shrug  were normal and symmetric.Tongue protrusion into cheek strength was normal. Motor: normal bulk and tone, full strength in the BUE, BLE, No focal weakness Coordination: finger-nose-finger, heel-to-shin bilaterally, no dysmetria Reflexes: Symmetric upper and lower, plantar responses were flexor bilaterally. Gait and Station: Rising up from seated position without assistance, normal stance, moderate stride, good arm swing, smooth turning, able to perform tiptoe, and heel walking without difficulty. Tandem gait is steady. No assistive device   DIAGNOSTIC DATA (LABS, IMAGING, TESTING) - ASSESSMENT AND PLAN  63 y.o. year old female  Complex partial seizure  Last episode was in February 2014  Tolerating Vimpat 200 mg twice a day well,  Refilled her prescription  Return to clinic in with nurse practitioner Judson Roch in 1 year  Marcial Pacas, M.D. Ph.D.  [facility] [place],  [address] Phone: [phone] Fax:      [phone]

## 2019-02-11 ENCOUNTER — Other Ambulatory Visit: Payer: Self-pay | Admitting: Nurse Practitioner

## 2019-04-23 ENCOUNTER — Telehealth: Payer: Self-pay | Admitting: Neurology

## 2019-04-23 NOTE — Telephone Encounter (Signed)
Sara Lawson with cover [employer] called to check on a PA for vimpat 200 mg oral tablets. States requests had a couple of errors and the best thing to do would be to contact insurance [employer] [facility] and submit the request verbally.   Please follow up.  [employer] rx # [phone]

## 2019-04-23 NOTE — Telephone Encounter (Signed)
PA completed through the patient new insurance. She has [employer]. Member ID [id number] RX BIN [id number]. I completed the PA online on cover my meds through [employer] [employer]-[id number] Will wait for response.

## 2019-05-16 DIAGNOSIS — S134XXA Sprain of ligaments of cervical spine, initial encounter: Secondary | ICD-10-CM | POA: Diagnosis not present

## 2019-05-16 DIAGNOSIS — M9901 Segmental and somatic dysfunction of cervical region: Secondary | ICD-10-CM | POA: Diagnosis not present

## 2019-05-16 DIAGNOSIS — M5414 Radiculopathy, thoracic region: Secondary | ICD-10-CM | POA: Diagnosis not present

## 2019-05-16 DIAGNOSIS — M9902 Segmental and somatic dysfunction of thoracic region: Secondary | ICD-10-CM | POA: Diagnosis not present

## 2019-05-16 DIAGNOSIS — M9903 Segmental and somatic dysfunction of lumbar region: Secondary | ICD-10-CM | POA: Diagnosis not present

## 2019-05-16 DIAGNOSIS — M5417 Radiculopathy, lumbosacral region: Secondary | ICD-10-CM | POA: Diagnosis not present

## 2019-05-23 DIAGNOSIS — S134XXA Sprain of ligaments of cervical spine, initial encounter: Secondary | ICD-10-CM | POA: Diagnosis not present

## 2019-05-23 DIAGNOSIS — M9902 Segmental and somatic dysfunction of thoracic region: Secondary | ICD-10-CM | POA: Diagnosis not present

## 2019-05-23 DIAGNOSIS — M5414 Radiculopathy, thoracic region: Secondary | ICD-10-CM | POA: Diagnosis not present

## 2019-05-23 DIAGNOSIS — M9901 Segmental and somatic dysfunction of cervical region: Secondary | ICD-10-CM | POA: Diagnosis not present

## 2019-05-23 DIAGNOSIS — M9903 Segmental and somatic dysfunction of lumbar region: Secondary | ICD-10-CM | POA: Diagnosis not present

## 2019-05-23 DIAGNOSIS — M5417 Radiculopathy, lumbosacral region: Secondary | ICD-10-CM | POA: Diagnosis not present

## 2019-05-30 DIAGNOSIS — M5414 Radiculopathy, thoracic region: Secondary | ICD-10-CM | POA: Diagnosis not present

## 2019-05-30 DIAGNOSIS — M9903 Segmental and somatic dysfunction of lumbar region: Secondary | ICD-10-CM | POA: Diagnosis not present

## 2019-05-30 DIAGNOSIS — M9902 Segmental and somatic dysfunction of thoracic region: Secondary | ICD-10-CM | POA: Diagnosis not present

## 2019-05-30 DIAGNOSIS — S134XXA Sprain of ligaments of cervical spine, initial encounter: Secondary | ICD-10-CM | POA: Diagnosis not present

## 2019-05-30 DIAGNOSIS — M9901 Segmental and somatic dysfunction of cervical region: Secondary | ICD-10-CM | POA: Diagnosis not present

## 2019-05-30 DIAGNOSIS — M5417 Radiculopathy, lumbosacral region: Secondary | ICD-10-CM | POA: Diagnosis not present

## 2019-06-02 ENCOUNTER — Other Ambulatory Visit: Payer: Self-pay

## 2019-06-03 ENCOUNTER — Ambulatory Visit (INDEPENDENT_AMBULATORY_CARE_PROVIDER_SITE_OTHER): Payer: PPO | Admitting: Family Medicine

## 2019-06-03 ENCOUNTER — Encounter: Payer: Self-pay | Admitting: Family Medicine

## 2019-06-03 VITALS — BP 142/82 | HR 73 | Temp 97.7°F | Ht 65.0 in | Wt 178.2 lb

## 2019-06-03 DIAGNOSIS — G40209 Localization-related (focal) (partial) symptomatic epilepsy and epileptic syndromes with complex partial seizures, not intractable, without status epilepticus: Secondary | ICD-10-CM | POA: Diagnosis not present

## 2019-06-03 DIAGNOSIS — E785 Hyperlipidemia, unspecified: Secondary | ICD-10-CM | POA: Diagnosis not present

## 2019-06-03 DIAGNOSIS — F17200 Nicotine dependence, unspecified, uncomplicated: Secondary | ICD-10-CM | POA: Diagnosis not present

## 2019-06-03 DIAGNOSIS — F17210 Nicotine dependence, cigarettes, uncomplicated: Secondary | ICD-10-CM | POA: Diagnosis not present

## 2019-06-03 DIAGNOSIS — E039 Hypothyroidism, unspecified: Secondary | ICD-10-CM | POA: Diagnosis not present

## 2019-06-03 NOTE — Assessment & Plan Note (Signed)
Continue Vimpat per neurology.

## 2019-06-03 NOTE — Assessment & Plan Note (Signed)
Continue atorvastatin 20 mg 3 times daily.  Check lipid panel next blood draw.

## 2019-06-03 NOTE — Progress Notes (Signed)
Sara Lawson is a 64 y.o. female who presents today for an office visit.  Assessment/Plan:  Chronic Problems Addressed Today: Current smoker Patient was asked about her tobacco use today and was strongly advised to quit. Patient is currently noncontemplative. We reviewed treatment options to assist her quit smoking including NRT, Chantix, and Bupropion. She did not tolerate wellbutrin in the pastFollow up at next office visit.   Total time spent counseling approximately 3 minutes.   Will place referral for lung cancer screening as well.    Hypothyroidism Continue Synthroid 100 mcg daily.  Check TSH next blood draw.  Seizure disorder, complex partial Continue Vimpat per neurology.  Dyslipidemia Continue atorvastatin 20 mg 3 times daily.  Check lipid panel next blood draw.  Elevated BP Reading At goal per [facility] 8.  Not currently on any medications.  We will recheck at next office visit.  Preventative health care She will follow up in a few months for her welcome to Medicare Visit.     Subjective:  HPI:  Patient is here to establish care as new patient.  She has several medical conditions are outlined below.  Overall she feels like she is doing well.  She has had some increased stress recently.  She is worried about her husband who she is worried to have depression and/or dementia.  He has been more forgetful lately.  # Hypothyroidism - On synthroid 145mcg daily and tolerating well  # Dyslipidemia - On lipitor 20mg  three times per week and tolerating well - ROS: No reported myalgias  # Seizures - Follows with neurology - On lacosamide 200mg  twice daily  PMH:  The following were reviewed and entered/updated in epic: Past Medical History:  Diagnosis Date  . Arthritis   . Benign ovarian tumor   . Celiac disease    diag 2014 Dr Collene Mares  . Chronic head pain   . Chronic neck pain   . Colon polyps   . Diverticulitis   . Hyperlipidemia   . Migraine    not currently   . Seizure [facility])    Patient Active Problem List   Diagnosis Date Noted  . Dyslipidemia 06/03/2019  . Back pain 06/02/2016  . Endometrial polyp 09/06/2014  . Seizure disorder, complex partial ([place]) 09/11/2013  . CD (celiac disease) 02/10/2013  . Current smoker 08/06/2007  . Hypothyroidism 10/05/2006   Past Surgical History:  Procedure Laterality Date  . appendix removed    . AUGMENTATION MAMMAPLASTY Bilateral 2002  . BREAST ENHANCEMENT SURGERY    . HYSTEROTOMY    . OOPHORECTOMY     left ovary removed-tumor on ovary  . TONSILLECTOMY      Family History  Problem Relation Age of Onset  . Colon cancer Mother        had hysterectomy in late 30s-not sure if due to any problems, ? Celiac disease  . Hypertension Mother   . Diabetes Mother   . Heart disease Mother   . Dementia Mother   . Colon cancer Maternal Uncle        x2  . Diabetes Maternal Grandmother   . Diabetes Maternal Uncle        x2  . Diabetes Maternal Uncle   . Stroke Other        maternal great grandfather-diabetes  . Thyroid disease Other        maternal side  . Diabetes Sister   . Diabetes Brother        type 2  Medications- reviewed and updated Current Outpatient Medications  Medication Sig Dispense Refill  . atorvastatin (LIPITOR) 20 MG tablet Take 1 pill three nights/week (Mon-Wed-Fri)    . cholecalciferol (VITAMIN D) 1000 units tablet Take 2,000 Units by mouth daily.    Marland Kitchen lacosamide (VIMPAT) 200 MG TABS tablet TAKE ONE TABLET BY MOUTH TWO TIMES A DAY 180 tablet 3  . MAGNESIUM PO Take 1 tablet by mouth daily.    . Multiple Vitamins-Minerals (CENTRUM SILVER 50+WOMEN) TABS Take by mouth. One daily    . Omega-3 Fatty Acids (FISH OIL) 1000 MG CAPS Take by mouth. One daily    . SYNTHROID 100 MCG tablet      No current facility-administered medications for this visit.    Allergies-reviewed and updated Allergies  Allergen Reactions  . Cefdinir     diarrhea  . Keppra [Levetiracetam]     Burning  in eyes  . Penicillins     REACTION: rash---she is able to take cephalosporins    Social History   Socioeconomic History  . Marital status: Married    Spouse name: Mikki Santee  . Number of children: 3  . Years of education: Not on file  . Highest education level: Not on file  Occupational History  . Not on file  Tobacco Use  . Smoking status: Current Every Day Smoker    Packs/day: 0.25    Types: Cigarettes  . Smokeless tobacco: Never Used  Substance and Sexual Activity  . Alcohol use: Yes    Alcohol/week: 0.0 standard drinks    Comment: occ  . Drug use: No  . Sexual activity: Never    Partners: Male  Other Topics Concern  . Not on file  Social History Narrative   Patient lives at home with her husband Mikki Santee and been married for 10 years.    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   Patient has 3 children.   Patient has some college education.   Patient drinks 6 cups of coffee daily.       Social Determinants of Health   Financial Resource Strain:   . Difficulty of Paying Living Expenses: Not on file  Food Insecurity:   . Worried About Charity fundraiser in the Last Year: Not on file  . Ran Out of Food in the Last Year: Not on file  Transportation Needs:   . Lack of Transportation (Medical): Not on file  . Lack of Transportation (Non-Medical): Not on file  Physical Activity:   . Days of Exercise per Week: Not on file  . Minutes of Exercise per Session: Not on file  Stress:   . Feeling of Stress : Not on file  Social Connections:   . Frequency of Communication with Friends and Family: Not on file  . Frequency of Social Gatherings with Friends and Family: Not on file  . Attends Religious Services: Not on file  . Active Member of Clubs or Organizations: Not on file  . Attends Archivist Meetings: Not on file  . Marital Status: Not on file        Objective:  Physical Exam: BP (!) 142/82   Pulse 73   Temp 97.7 F (36.5 C)   Ht 5\' 5"  (1.651 m)   Wt  178 lb 3.2 oz (80.8 kg)   LMP 05/01/2004 Comment: or 2007  SpO2 97%   BMI 29.65 kg/m   Gen: No acute distress, resting comfortably CV: Regular rate and rhythm with no murmurs  appreciated Pulm: Normal work of breathing, clear to auscultation bilaterally with no crackles, wheezes, or rhonchi Neuro: Grossly normal, moves all extremities Psych: Normal affect and thought content  Time Spent: 65 minutes of total time was spent on the date of the encounter performing the following actions: chart review prior to seeing the patient, obtaining history, performing a medically necessary exam, counseling on the treatment plan, placing orders, and documenting in our EHR.        Algis Greenhouse. Jerline Pain, MD 06/03/2019 2:24 PM

## 2019-06-03 NOTE — Patient Instructions (Signed)
It was very nice to see you today!  I will place a referral for you to have lung cancer screening.  Come back in September for your welcome to medicare visit, or sooner if needed.   Take care, Dr Jerline Pain  Please try these tips to maintain a healthy lifestyle:   Eat at least 3 REAL meals and 1-2 snacks per day.  Aim for no more than 5 hours between eating.  If you eat breakfast, please do so within one hour of getting up.    Each meal should contain half fruits/vegetables, one quarter protein, and one quarter carbs (no bigger than a computer mouse)   Cut down on sweet beverages. This includes juice, soda, and sweet tea.     Drink at least 1 glass of water with each meal and aim for at least 8 glasses per day   Exercise at least 150 minutes every week.

## 2019-06-03 NOTE — Assessment & Plan Note (Signed)
Patient was asked about her tobacco use today and was strongly advised to quit. Patient is currently noncontemplative. We reviewed treatment options to assist her quit smoking including NRT, Chantix, and Bupropion. She did not tolerate wellbutrin in the pastFollow up at next office visit.   Total time spent counseling approximately 3 minutes.   Will place referral for lung cancer screening as well.

## 2019-06-03 NOTE — Assessment & Plan Note (Signed)
Continue Synthroid 100 mcg daily.  Check TSH next blood draw.

## 2019-06-05 ENCOUNTER — Other Ambulatory Visit: Payer: Self-pay | Admitting: *Deleted

## 2019-06-05 DIAGNOSIS — Z87891 Personal history of nicotine dependence: Secondary | ICD-10-CM

## 2019-06-05 DIAGNOSIS — F17210 Nicotine dependence, cigarettes, uncomplicated: Secondary | ICD-10-CM

## 2019-06-06 DIAGNOSIS — S134XXA Sprain of ligaments of cervical spine, initial encounter: Secondary | ICD-10-CM | POA: Diagnosis not present

## 2019-06-06 DIAGNOSIS — M5417 Radiculopathy, lumbosacral region: Secondary | ICD-10-CM | POA: Diagnosis not present

## 2019-06-06 DIAGNOSIS — M9903 Segmental and somatic dysfunction of lumbar region: Secondary | ICD-10-CM | POA: Diagnosis not present

## 2019-06-06 DIAGNOSIS — M9901 Segmental and somatic dysfunction of cervical region: Secondary | ICD-10-CM | POA: Diagnosis not present

## 2019-06-06 DIAGNOSIS — M5414 Radiculopathy, thoracic region: Secondary | ICD-10-CM | POA: Diagnosis not present

## 2019-06-06 DIAGNOSIS — M9902 Segmental and somatic dysfunction of thoracic region: Secondary | ICD-10-CM | POA: Diagnosis not present

## 2019-06-16 DIAGNOSIS — S134XXA Sprain of ligaments of cervical spine, initial encounter: Secondary | ICD-10-CM | POA: Diagnosis not present

## 2019-06-16 DIAGNOSIS — M9902 Segmental and somatic dysfunction of thoracic region: Secondary | ICD-10-CM | POA: Diagnosis not present

## 2019-06-16 DIAGNOSIS — M9903 Segmental and somatic dysfunction of lumbar region: Secondary | ICD-10-CM | POA: Diagnosis not present

## 2019-06-16 DIAGNOSIS — M5414 Radiculopathy, thoracic region: Secondary | ICD-10-CM | POA: Diagnosis not present

## 2019-06-16 DIAGNOSIS — M9901 Segmental and somatic dysfunction of cervical region: Secondary | ICD-10-CM | POA: Diagnosis not present

## 2019-06-16 DIAGNOSIS — M5417 Radiculopathy, lumbosacral region: Secondary | ICD-10-CM | POA: Diagnosis not present

## 2019-06-20 DIAGNOSIS — M9902 Segmental and somatic dysfunction of thoracic region: Secondary | ICD-10-CM | POA: Diagnosis not present

## 2019-06-20 DIAGNOSIS — M5417 Radiculopathy, lumbosacral region: Secondary | ICD-10-CM | POA: Diagnosis not present

## 2019-06-20 DIAGNOSIS — S134XXA Sprain of ligaments of cervical spine, initial encounter: Secondary | ICD-10-CM | POA: Diagnosis not present

## 2019-06-20 DIAGNOSIS — M9903 Segmental and somatic dysfunction of lumbar region: Secondary | ICD-10-CM | POA: Diagnosis not present

## 2019-06-20 DIAGNOSIS — M9901 Segmental and somatic dysfunction of cervical region: Secondary | ICD-10-CM | POA: Diagnosis not present

## 2019-06-20 DIAGNOSIS — M5414 Radiculopathy, thoracic region: Secondary | ICD-10-CM | POA: Diagnosis not present

## 2019-06-23 ENCOUNTER — Ambulatory Visit: Payer: 59 | Admitting: Neurology

## 2019-06-27 DIAGNOSIS — S134XXA Sprain of ligaments of cervical spine, initial encounter: Secondary | ICD-10-CM | POA: Diagnosis not present

## 2019-06-27 DIAGNOSIS — M9903 Segmental and somatic dysfunction of lumbar region: Secondary | ICD-10-CM | POA: Diagnosis not present

## 2019-06-27 DIAGNOSIS — M9901 Segmental and somatic dysfunction of cervical region: Secondary | ICD-10-CM | POA: Diagnosis not present

## 2019-06-27 DIAGNOSIS — M5414 Radiculopathy, thoracic region: Secondary | ICD-10-CM | POA: Diagnosis not present

## 2019-06-27 DIAGNOSIS — M9902 Segmental and somatic dysfunction of thoracic region: Secondary | ICD-10-CM | POA: Diagnosis not present

## 2019-06-27 DIAGNOSIS — M5417 Radiculopathy, lumbosacral region: Secondary | ICD-10-CM | POA: Diagnosis not present

## 2019-06-30 ENCOUNTER — Ambulatory Visit (INDEPENDENT_AMBULATORY_CARE_PROVIDER_SITE_OTHER)
Admission: RE | Admit: 2019-06-30 | Discharge: 2019-06-30 | Disposition: A | Discharge status: Home / Self Care | Payer: PPO | Source: Ambulatory Visit | Attending: Acute Care | Admitting: Acute Care

## 2019-06-30 ENCOUNTER — Other Ambulatory Visit: Payer: Self-pay | Admitting: Neurology

## 2019-06-30 ENCOUNTER — Other Ambulatory Visit: Payer: Self-pay

## 2019-06-30 ENCOUNTER — Encounter: Payer: Self-pay | Admitting: Acute Care

## 2019-06-30 ENCOUNTER — Ambulatory Visit (INDEPENDENT_AMBULATORY_CARE_PROVIDER_SITE_OTHER): Payer: PPO | Admitting: Acute Care

## 2019-06-30 VITALS — BP 132/80 | HR 80 | Temp 97.3°F | Ht 65.0 in | Wt 181.0 lb

## 2019-06-30 DIAGNOSIS — F17210 Nicotine dependence, cigarettes, uncomplicated: Secondary | ICD-10-CM | POA: Diagnosis not present

## 2019-06-30 DIAGNOSIS — Z87891 Personal history of nicotine dependence: Secondary | ICD-10-CM

## 2019-06-30 NOTE — Progress Notes (Signed)
Shared Decision Making Visit Lung Cancer Screening Program [phone])   Eligibility:  Age 64 y.o.  Pack Years Smoking History Calculation 37 pack year smoking history (# packs/per year x # years smoked)  Recent History of coughing up blood  no  Unexplained weight loss? no ( >Than 15 pounds within the last 6 months )  Prior History Lung / other cancer no (Diagnosis within the last 5 years already requiring surveillance chest CT Scans).  Smoking Status Current Smoker  Former Smokers: Years since quit: NA  Quit Date: NA  Visit Components:  Discussion included one or more decision making aids. yes  Discussion included risk/benefits of screening. yes  Discussion included potential follow up diagnostic testing for abnormal scans. yes  Discussion included meaning and risk of over diagnosis. yes  Discussion included meaning and risk of False Positives. yes  Discussion included meaning of total radiation exposure. yes  Counseling Included:  Importance of adherence to annual lung cancer LDCT screening. yes  Impact of comorbidities on ability to participate in the program. yes  Ability and willingness to under diagnostic treatment. yes  Smoking Cessation Counseling:  Current Smokers:   Discussed importance of smoking cessation. yes  Information about tobacco cessation classes and interventions provided to patient. yes  Patient provided with "ticket" for LDCT Scan. yes  Symptomatic Patient. no  Counseling  Diagnosis Code: Tobacco Use Z72.0  Asymptomatic Patient yes  Counseling (Intermediate counseling: > three minutes counseling) [id number]  Former Smokers:   Discussed the importance of maintaining cigarette abstinence. yes  Diagnosis Code: Personal History of Nicotine Dependence. [id number]  Information about tobacco cessation classes and interventions provided to patient. Yes  Patient provided with "ticket" for LDCT Scan. yes  Written Order for Lung Cancer  Screening with LDCT placed in Epic. Yes (CT Chest Lung Cancer Screening Low Dose W/O CM) [id number] Z12.2-Screening of respiratory organs Z87.891-Personal history of nicotine dependence  BP 132/80 (BP Location: Left Arm, Cuff Size: Normal)   Pulse 80   Temp (!) 97.3 F (36.3 C) (Temporal)   Ht 5\' 5"  (1.651 m)   Wt 181 lb (82.1 kg)   LMP 05/01/2004 Comment: or 2007  SpO2 97%   BMI 30.12 kg/m    I have spent 25 minutes of face to face time with Ms. Liley  discussing the risks and benefits of lung cancer screening. We viewed a power point together that explained in detail the above noted topics. We paused at intervals to allow for questions to be asked and answered to ensure understanding.We discussed that the single most powerful action that she can take to decrease her risk of developing lung cancer is to quit smoking. We discussed whether or not she is ready to commit to setting a quit date. We discussed options for tools to aid in quitting smoking including nicotine replacement therapy, non-nicotine medications, support groups, Quit Smart classes, and behavior modification. We discussed that often times setting smaller, more achievable goals, such as eliminating 1 cigarette a day for a week and then 2 cigarettes a day for a week can be helpful in slowly decreasing the number of cigarettes smoked. This allows for a sense of accomplishment as well as providing a clinical benefit. I gave her the " Be Stronger Than Your Excuses" card with contact information for community resources, classes, free nicotine replacement therapy, and access to mobile apps, text messaging, and on-line smoking cessation help. I have also given her my card and contact information in the event she needs  to contact me. We discussed the time and location of the scan, and that either Doroteo Glassman RN or I will call with the results within 24-48 hours of receiving them. I have offered her  a copy of the power point we viewed  as a  resource in the event they need reinforcement of the concepts we discussed today in the office. The patient verbalized understanding of all of  the above and had no further questions upon leaving the office. They have my contact information in the event they have any further questions.  I spent 3 minutes counseling on smoking cessation and the health risks of continued tobacco abuse.  I explained to the patient that there has been a high incidence of coronary artery disease noted on these exams. I explained that this is a non-gated exam therefore degree or severity cannot be determined. This patient is currently on statin therapy. I have asked the patient to follow-up with their PCP regarding any incidental finding of coronary artery disease and management with diet or medication as their PCP  feels is clinically indicated. The patient verbalized understanding of the above and had no further questions upon completion of the visit.      Magdalen Spatz, NP 06/30/2019 3:58 PM

## 2019-06-30 NOTE — Patient Instructions (Signed)
Thank you for participating in the [facility]. It was our pleasure to meet you today. We will call you with the results of your scan within the next few days. Your scan will be assigned a Lung RADS category score by the physicians reading the scans.  This Lung RADS score determines follow up scanning.  See below for description of categories, and follow up screening recommendations. We will be in touch to schedule your follow up screening annually or based on recommendations of our providers. We will fax a copy of your scan results to your Primary Care Physician, or the physician who referred you to the program, to ensure they have the results. Please call the office if you have any questions or concerns regarding your scanning experience or results.  Our office number is [phone]. Please speak with Denise Phelps, RN. She is our Lung Cancer Screening RN. If she is unavailable when you call, please have the office staff send her a message. She will return your call at her earliest convenience. Remember, if your scan is normal, we will scan you annually as long as you continue to meet the criteria for the program. (Age 55-77, Current smoker or smoker who has quit within the last 15 years). If you are a smoker, remember, quitting is the single most powerful action that you can take to decrease your risk of lung cancer and other pulmonary, breathing related problems. We know quitting is hard, and we are here to help.  Please let us know if there is anything we can do to help you meet your goal of quitting. If you are a former smoker, congratulations. We are proud of you! Remain smoke free! Remember you can refer friends or family members through the number above.  We will screen them to make sure they meet criteria for the program. Thank you for helping us take better care of you by participating in Lung Screening.  Lung RADS Categories:  Lung RADS 1: no nodules  or definitely non-concerning nodules.  Recommendation is for a repeat annual scan in 12 months.  Lung RADS 2:  nodules that are non-concerning in appearance and behavior with a very low likelihood of becoming an active cancer. Recommendation is for a repeat annual scan in 12 months.  Lung RADS 3: nodules that are probably non-concerning , includes nodules with a low likelihood of becoming an active cancer.  Recommendation is for a 6-month repeat screening scan. Often noted after an upper respiratory illness. We will be in touch to make sure you have no questions, and to schedule your 6-month scan.  Lung RADS 4 A: nodules with concerning findings, recommendation is most often for a follow up scan in 3 months or additional testing based on our provider's assessment of the scan. We will be in touch to make sure you have no questions and to schedule the recommended 3 month follow up scan.  Lung RADS 4 B:  indicates findings that are concerning. We will be in touch with you to schedule additional diagnostic testing based on our provider's  assessment of the scan.   

## 2019-07-02 ENCOUNTER — Encounter: Payer: Self-pay | Admitting: Family Medicine

## 2019-07-03 ENCOUNTER — Ambulatory Visit: Payer: PPO | Admitting: Neurology

## 2019-07-03 ENCOUNTER — Encounter: Payer: Self-pay | Admitting: Neurology

## 2019-07-03 ENCOUNTER — Other Ambulatory Visit: Payer: Self-pay

## 2019-07-03 ENCOUNTER — Ambulatory Visit: Payer: 59 | Admitting: Neurology

## 2019-07-03 VITALS — BP 148/86 | HR 78 | Ht 65.0 in | Wt 178.0 lb

## 2019-07-03 DIAGNOSIS — G40209 Localization-related (focal) (partial) symptomatic epilepsy and epileptic syndromes with complex partial seizures, not intractable, without status epilepticus: Secondary | ICD-10-CM | POA: Diagnosis not present

## 2019-07-03 MED ORDER — LAMOTRIGINE 25 MG PO TABS: ORAL_TABLET | ORAL | 0 refills | Status: DC

## 2019-07-03 MED ORDER — VIMPAT 100 MG PO TABS: 100.0000 mg | ORAL_TABLET | Freq: Two times a day (BID) | ORAL | 0 refills | Status: DC

## 2019-07-03 MED ORDER — LACOSAMIDE 200 MG PO TABS: 200.0000 mg | ORAL_TABLET | Freq: Two times a day (BID) | ORAL | 0 refills | Status: DC

## 2019-07-03 NOTE — Progress Notes (Signed)
Please call patient and let them  know their  low dose Ct was read as a Lung RADS 2: nodules that are benign in appearance and behavior with a very low likelihood of becoming a clinically active cancer due to size or lack of growth. Recommendation per radiology is for a repeat LDCT in 12 months. .Please let them  know we will order and schedule their  annual screening scan for 06/2020. Please let them  know there was notation of CAD on their  scan.  Please remind the patient  that this is a non-gated exam therefore degree or severity of disease  cannot be determined. Please have them  follow up with their PCP regarding potential risk factor modification, dietary therapy or pharmacologic therapy if clinically indicated. Pt.  is not  currently on statin therapy. Please place order for annual  screening scan for  06/2020 and fax results to PCP. Thanks so much. 

## 2019-07-03 NOTE — Addendum Note (Signed)
Addended by: Thomes Cake on: 07/03/2019 03:27 PM   Modules accepted: Orders

## 2019-07-03 NOTE — Patient Instructions (Addendum)
Start taking Lamictal working up to 100 mg twice daily, once you finish the titration, call for a new prescription for 100 mg tablets.   1 tablet twice a day for the first week 2 tablets twice a day for the second week 3 tablets twice a day for the third week 4 tablets twice a day for the fourth week  For total of 140 tablets  After finish titration with small dose of lamotrigine 25 mg, change to lamotrigine 100 mg twice a day   I will sample you Vimpat  Take 200 mg twice daily x 2 weeks  Then take 100 mg twice daily stop then stop   Watch for rash  See you back in 6 months

## 2019-07-03 NOTE — Progress Notes (Signed)
PATIENT: Sara Lawson DOB: 1956-02-19  REASON FOR VISIT: follow up HISTORY FROM: patient  HISTORY OF PRESENT ILLNESS: Today 07/03/19  HISTORY  HISTORY: She started to have seizure since Jan 2010 after she fell and slammed her head at an ice cooler at gas station, no LOC, after that she started to have the episodes.  She has past medical history of hypothyroidism, thyroid cysts, chronic neck pain, headache, presenting with 6 month history of intermittent episodes of confusion.  She reported all presentation has similar seminology. She would suddenly lost connection with the reality, feel like " I am not there". never totally lost consciousness, sometimes, it's proceed by smell some strong chemical smells, or exhaustion, stomach knot like sensation, with nausea. MRI :mild-moderate atrophy, EEG normal.  Jun 26 2012 She continues to have recurrent seizures, sometimes multiple episodes in a day, over years, she was changed to [place] XR, currently taking 500 mg 2 tablets every night, also on titrating dose of Vimpat, she is taking 150 mg twice a day,   Last seizure was in June 2013, in June 24 2012, she woke up having recurrent episode of seizure every 30-60 minutes, for a total of 7 episodes, stomach cramping, overwhelming sensation, lasting for 1 minutes, she took extra dose of 150 mg vimpat, one more recurrent episode afterwards, and then stop, she denies significant side effects from medications   UPDATE Jun 17 2018: She cannot tolerate Keppra due to significant side effect,  was switched to Vimpat since March 2018, now taking 200 mg twice a day, tolerating it well, there was no longer recurrent episode,  Update 07/03/2019: She is doing well on Vimpat, but can no longer afford the medication.  She has not had recurrent seizure.  Her last seizure occurred in February 2014, described as an overwhelming sensation, like she was in an evil cartoon, and her stomach  clenches up.  She denies new problems or concerns.  Says routine lab work has been normal.  REVIEW OF SYSTEMS: Out of a complete 14 system review of symptoms, the patient complains only of the following symptoms, and all other reviewed systems are negative.  Seizure  ALLERGIES: Allergies  Allergen Reactions  . Cefdinir     diarrhea  . Keppra [Levetiracetam]     Burning in eyes  . Penicillins     REACTION: rash---she is able to take cephalosporins    HOME MEDICATIONS: Outpatient Medications Prior to Visit  Medication Sig Dispense Refill  . cholecalciferol (VITAMIN D) 1000 units tablet Take 2,000 Units by mouth daily.    Marland Kitchen lacosamide (VIMPAT) 200 MG TABS tablet TAKE ONE TABLET BY MOUTH TWICE A DAY 60 tablet 0  . MAGNESIUM PO Take 1 tablet by mouth daily.    . Multiple Vitamins-Minerals (CENTRUM SILVER 50+WOMEN) TABS Take by mouth. One daily    . Omega-3 Fatty Acids (FISH OIL) 1000 MG CAPS Take by mouth. One daily    . SYNTHROID 100 MCG tablet      No facility-administered medications prior to visit.    PAST MEDICAL HISTORY: Past Medical History:  Diagnosis Date  . Arthritis   . Benign ovarian tumor   . Celiac disease    diag 2014 Dr Collene Mares  . Chronic head pain   . Chronic neck pain   . Colon polyps   . Diverticulitis   . Hyperlipidemia   . Migraine    not currently  . Seizure ([place])     PAST SURGICAL HISTORY: Past  Surgical History:  Procedure Laterality Date  . appendix removed    . AUGMENTATION MAMMAPLASTY Bilateral 2002  . BREAST ENHANCEMENT SURGERY    . HYSTEROTOMY    . OOPHORECTOMY     left ovary removed-tumor on ovary  . TONSILLECTOMY      FAMILY HISTORY: Family History  Problem Relation Age of Onset  . Colon cancer Mother        had hysterectomy in late 30s-not sure if due to any problems, ? Celiac disease  . Hypertension Mother   . Diabetes Mother   . Heart disease Mother   . Dementia Mother   . Colon cancer Maternal Uncle        x2  . Diabetes  Maternal Grandmother   . Diabetes Maternal Uncle        x2  . Diabetes Maternal Uncle   . Stroke Other        maternal great grandfather-diabetes  . Thyroid disease Other        maternal side  . Diabetes Sister   . Diabetes Brother        type 2    SOCIAL HISTORY: Social History   Socioeconomic History  . Marital status: Married    Spouse name: Mikki Santee  . Number of children: 3  . Years of education: Not on file  . Highest education level: Not on file  Occupational History  . Not on file  Tobacco Use  . Smoking status: Current Every Day Smoker    Packs/day: 0.75    Years: 50.00    Pack years: 37.50    Types: Cigarettes  . Smokeless tobacco: Never Used  Substance and Sexual Activity  . Alcohol use: Yes    Alcohol/week: 0.0 standard drinks    Comment: occ  . Drug use: No  . Sexual activity: Never    Partners: Male  Other Topics Concern  . Not on file  Social History Narrative   Patient lives at home with her husband Mikki Santee and been married for 10 years.    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   Patient has 3 children.   Patient has some college education.   Patient drinks 6 cups of coffee daily.       Social Determinants of Health   Financial Resource Strain:   . Difficulty of Paying Living Expenses: Not on file  Food Insecurity:   . Worried About Charity fundraiser in the Last Year: Not on file  . Ran Out of Food in the Last Year: Not on file  Transportation Needs:   . Lack of Transportation (Medical): Not on file  . Lack of Transportation (Non-Medical): Not on file  Physical Activity:   . Days of Exercise per Week: Not on file  . Minutes of Exercise per Session: Not on file  Stress:   . Feeling of Stress : Not on file  Social Connections:   . Frequency of Communication with Friends and Family: Not on file  . Frequency of Social Gatherings with Friends and Family: Not on file  . Attends Religious Services: Not on file  . Active Member of Clubs or  Organizations: Not on file  . Attends Archivist Meetings: Not on file  . Marital Status: Not on file  Intimate Partner Violence:   . Fear of Current or Ex-Partner: Not on file  . Emotionally Abused: Not on file  . Physically Abused: Not on file  . Sexually Abused: Not on  file   PHYSICAL EXAM  Vitals:   07/03/19 1430  BP: (!) 148/86  Pulse: 78  Weight: 178 lb (80.7 kg)  Height: 5\' 5"  (1.651 m)   Body mass index is 29.62 kg/m.  Generalized: Well developed, in no acute distress   Neurological examination  Mentation: Alert oriented to time, place, history taking. Follows all commands speech and language fluent Cranial nerve II-XII: Pupils were equal round reactive to light. Extraocular movements were full, visual field were full on confrontational test. Facial sensation and strength were normal. Head turning and shoulder shrug were normal and symmetric. Motor: The motor testing reveals 5 over 5 strength of all 4 extremities. Good symmetric motor tone is noted throughout.  Sensory: Sensory testing is intact to soft touch on all 4 extremities. No evidence of extinction is noted.  Coordination: Cerebellar testing reveals good finger-nose-finger and heel-to-shin bilaterally.  Gait and station: Gait is normal.  Reflexes: Deep tendon reflexes are symmetric and normal bilaterally.   DIAGNOSTIC DATA (LABS, IMAGING, TESTING) - I reviewed patient records, labs, notes, testing and imaging myself where available.  No results found for: WBC, HGB, HCT, MCV, PLT No results found for: NA, K, CL, CO2, GLUCOSE, BUN, CREATININE, CALCIUM, PROT, ALBUMIN, AST, ALT, ALKPHOS, BILITOT, GFRNONAA, GFRAA No results found for: CHOL, HDL, LDLCALC, LDLDIRECT, TRIG, CHOLHDL No results found for: HGBA1C No results found for: VITAMINB12 No results found for: TSH  ASSESSMENT AND PLAN 64 y.o. year old female  has a past medical history of Arthritis, Benign ovarian tumor, Celiac disease, Chronic head  pain, Chronic neck pain, Colon polyps, Diverticulitis, Hyperlipidemia, Migraine, and Seizure ([place]). here with:  1.  Complex partial seizure -Last seizure occurred in February 2014 -She can no longer afford Vimpat 200 mg twice daily -I will switch to Lamictal, 25 mg tablets, increasing to 100 mg twice a day -Discussed side effects, will fax 25 mg tablet prescription, once complete titration, will send in 100 mg tablet BID prescription -I have given her sample Vimpat (take 200 mg twice a day for 2 weeks, then take 100 mg twice a day for 1 week, then stop the medication) -Call for recurrent seizure -Vimpat and Keppra have similar mechanism of action on sodium channels -She has not been able to tolerate Keppra previously -Follow-up in 6 months or sooner if needed   I spent 15 minutes with the patient. 50% of this time was spent discussing her plan of care.   Butler Denmark, AGNP-C, DNP 07/03/2019, 3:21 PM [facility] [address] [place], [place] [address] [phone]

## 2019-07-04 ENCOUNTER — Telehealth: Payer: Self-pay | Admitting: Acute Care

## 2019-07-04 ENCOUNTER — Encounter: Payer: Self-pay | Admitting: *Deleted

## 2019-07-04 ENCOUNTER — Encounter: Payer: Self-pay | Admitting: Family Medicine

## 2019-07-04 DIAGNOSIS — F17210 Nicotine dependence, cigarettes, uncomplicated: Secondary | ICD-10-CM

## 2019-07-04 DIAGNOSIS — Z87891 Personal history of nicotine dependence: Secondary | ICD-10-CM

## 2019-07-04 NOTE — Telephone Encounter (Signed)
Pt informed of CT results per Sarah Groce, NP.  PT verbalized understanding.  Copy sent to PCP.  Order placed for 1 yr f/u CT.  

## 2019-07-07 ENCOUNTER — Other Ambulatory Visit: Payer: Self-pay

## 2019-07-07 DIAGNOSIS — M5417 Radiculopathy, lumbosacral region: Secondary | ICD-10-CM | POA: Diagnosis not present

## 2019-07-07 DIAGNOSIS — M9901 Segmental and somatic dysfunction of cervical region: Secondary | ICD-10-CM | POA: Diagnosis not present

## 2019-07-07 DIAGNOSIS — M5414 Radiculopathy, thoracic region: Secondary | ICD-10-CM | POA: Diagnosis not present

## 2019-07-07 DIAGNOSIS — M9902 Segmental and somatic dysfunction of thoracic region: Secondary | ICD-10-CM | POA: Diagnosis not present

## 2019-07-07 DIAGNOSIS — M9903 Segmental and somatic dysfunction of lumbar region: Secondary | ICD-10-CM | POA: Diagnosis not present

## 2019-07-07 DIAGNOSIS — S134XXA Sprain of ligaments of cervical spine, initial encounter: Secondary | ICD-10-CM | POA: Diagnosis not present

## 2019-07-07 MED ORDER — SIMVASTATIN 20 MG PO TABS: 20.0000 mg | ORAL_TABLET | ORAL | 0 refills | Status: DC

## 2019-07-07 NOTE — Telephone Encounter (Signed)
Replied via mychart.

## 2019-07-07 NOTE — Progress Notes (Signed)
I have reviewed and agreed above plan. 

## 2019-07-30 ENCOUNTER — Telehealth: Payer: Self-pay | Admitting: Neurology

## 2019-07-30 MED ORDER — LAMOTRIGINE 100 MG PO TABS: 100.0000 mg | ORAL_TABLET | Freq: Two times a day (BID) | ORAL | 1 refills | Status: DC

## 2019-07-30 NOTE — Telephone Encounter (Signed)
lamoTRIgine (LAMICTAL) 25 MG tablet Patient called to inform she has started taking the medication 4times a day this passed Sunday 3/28 and will be out this upcoming Sunday.   [facility] [address], [place]  [place], [place] [address]

## 2019-08-04 DIAGNOSIS — M9901 Segmental and somatic dysfunction of cervical region: Secondary | ICD-10-CM | POA: Diagnosis not present

## 2019-08-04 DIAGNOSIS — S134XXA Sprain of ligaments of cervical spine, initial encounter: Secondary | ICD-10-CM | POA: Diagnosis not present

## 2019-08-04 DIAGNOSIS — M9902 Segmental and somatic dysfunction of thoracic region: Secondary | ICD-10-CM | POA: Diagnosis not present

## 2019-08-04 DIAGNOSIS — M5417 Radiculopathy, lumbosacral region: Secondary | ICD-10-CM | POA: Diagnosis not present

## 2019-08-04 DIAGNOSIS — M5414 Radiculopathy, thoracic region: Secondary | ICD-10-CM | POA: Diagnosis not present

## 2019-08-04 DIAGNOSIS — M9903 Segmental and somatic dysfunction of lumbar region: Secondary | ICD-10-CM | POA: Diagnosis not present

## 2019-08-22 DIAGNOSIS — M5417 Radiculopathy, lumbosacral region: Secondary | ICD-10-CM | POA: Diagnosis not present

## 2019-08-22 DIAGNOSIS — M5414 Radiculopathy, thoracic region: Secondary | ICD-10-CM | POA: Diagnosis not present

## 2019-08-22 DIAGNOSIS — M9902 Segmental and somatic dysfunction of thoracic region: Secondary | ICD-10-CM | POA: Diagnosis not present

## 2019-08-22 DIAGNOSIS — M9901 Segmental and somatic dysfunction of cervical region: Secondary | ICD-10-CM | POA: Diagnosis not present

## 2019-08-22 DIAGNOSIS — M9903 Segmental and somatic dysfunction of lumbar region: Secondary | ICD-10-CM | POA: Diagnosis not present

## 2019-08-22 DIAGNOSIS — S134XXA Sprain of ligaments of cervical spine, initial encounter: Secondary | ICD-10-CM | POA: Diagnosis not present

## 2019-09-12 DIAGNOSIS — M9902 Segmental and somatic dysfunction of thoracic region: Secondary | ICD-10-CM | POA: Diagnosis not present

## 2019-09-12 DIAGNOSIS — M5414 Radiculopathy, thoracic region: Secondary | ICD-10-CM | POA: Diagnosis not present

## 2019-09-12 DIAGNOSIS — M9901 Segmental and somatic dysfunction of cervical region: Secondary | ICD-10-CM | POA: Diagnosis not present

## 2019-09-12 DIAGNOSIS — S134XXA Sprain of ligaments of cervical spine, initial encounter: Secondary | ICD-10-CM | POA: Diagnosis not present

## 2019-09-12 DIAGNOSIS — M9903 Segmental and somatic dysfunction of lumbar region: Secondary | ICD-10-CM | POA: Diagnosis not present

## 2019-09-12 DIAGNOSIS — M5417 Radiculopathy, lumbosacral region: Secondary | ICD-10-CM | POA: Diagnosis not present

## 2019-09-25 DIAGNOSIS — M9902 Segmental and somatic dysfunction of thoracic region: Secondary | ICD-10-CM | POA: Diagnosis not present

## 2019-09-25 DIAGNOSIS — M9901 Segmental and somatic dysfunction of cervical region: Secondary | ICD-10-CM | POA: Diagnosis not present

## 2019-09-25 DIAGNOSIS — M5414 Radiculopathy, thoracic region: Secondary | ICD-10-CM | POA: Diagnosis not present

## 2019-09-25 DIAGNOSIS — M9903 Segmental and somatic dysfunction of lumbar region: Secondary | ICD-10-CM | POA: Diagnosis not present

## 2019-09-25 DIAGNOSIS — M5417 Radiculopathy, lumbosacral region: Secondary | ICD-10-CM | POA: Diagnosis not present

## 2019-09-25 DIAGNOSIS — S134XXA Sprain of ligaments of cervical spine, initial encounter: Secondary | ICD-10-CM | POA: Diagnosis not present

## 2019-09-27 ENCOUNTER — Other Ambulatory Visit: Payer: Self-pay | Admitting: Family Medicine

## 2019-10-10 DIAGNOSIS — M9902 Segmental and somatic dysfunction of thoracic region: Secondary | ICD-10-CM | POA: Diagnosis not present

## 2019-10-10 DIAGNOSIS — M9903 Segmental and somatic dysfunction of lumbar region: Secondary | ICD-10-CM | POA: Diagnosis not present

## 2019-10-10 DIAGNOSIS — M5414 Radiculopathy, thoracic region: Secondary | ICD-10-CM | POA: Diagnosis not present

## 2019-10-10 DIAGNOSIS — S134XXA Sprain of ligaments of cervical spine, initial encounter: Secondary | ICD-10-CM | POA: Diagnosis not present

## 2019-10-10 DIAGNOSIS — M5417 Radiculopathy, lumbosacral region: Secondary | ICD-10-CM | POA: Diagnosis not present

## 2019-10-10 DIAGNOSIS — M9901 Segmental and somatic dysfunction of cervical region: Secondary | ICD-10-CM | POA: Diagnosis not present

## 2019-11-07 DIAGNOSIS — M9903 Segmental and somatic dysfunction of lumbar region: Secondary | ICD-10-CM | POA: Diagnosis not present

## 2019-11-07 DIAGNOSIS — M9901 Segmental and somatic dysfunction of cervical region: Secondary | ICD-10-CM | POA: Diagnosis not present

## 2019-11-07 DIAGNOSIS — S134XXA Sprain of ligaments of cervical spine, initial encounter: Secondary | ICD-10-CM | POA: Diagnosis not present

## 2019-11-07 DIAGNOSIS — M5414 Radiculopathy, thoracic region: Secondary | ICD-10-CM | POA: Diagnosis not present

## 2019-11-07 DIAGNOSIS — M9902 Segmental and somatic dysfunction of thoracic region: Secondary | ICD-10-CM | POA: Diagnosis not present

## 2019-11-07 DIAGNOSIS — M5417 Radiculopathy, lumbosacral region: Secondary | ICD-10-CM | POA: Diagnosis not present

## 2019-11-09 ENCOUNTER — Other Ambulatory Visit: Payer: Self-pay | Admitting: Family Medicine

## 2019-11-09 ENCOUNTER — Other Ambulatory Visit: Payer: Self-pay | Admitting: Neurology

## 2019-11-12 ENCOUNTER — Telehealth: Payer: Self-pay | Admitting: Neurology

## 2019-11-12 MED ORDER — LAMOTRIGINE 100 MG PO TABS: 100.0000 mg | ORAL_TABLET | Freq: Two times a day (BID) | ORAL | 0 refills | Status: AC

## 2019-11-12 NOTE — Addendum Note (Signed)
Addended by: Suzzanne Cloud on: 11/12/2019 12:24 PM   Modules accepted: Orders

## 2019-11-12 NOTE — Telephone Encounter (Signed)
I called pt and I relayed that prescription was sent relating in notes that can receive early.  She has had no brekthru seizures.  I moving to [place].  Leaving 11-28-19.  She will call pharmacy.

## 2019-11-12 NOTE — Telephone Encounter (Signed)
Patient called wanting to know about the refill on her medicine.

## 2019-11-12 NOTE — Telephone Encounter (Signed)
Pt has called re: her refill on lamoTRIgine (LAMICTAL) 100 MG tablet. She has been told she can not get filled until 07-30.  [place] out of [place] on [place].  Pt asking if because of this can it be filled earlier than the 30th.  Please call

## 2019-11-12 NOTE — Telephone Encounter (Signed)
Ok to fill, but is she going to keep coming here if she is moving? Make sure she is tolerating well, when last seen we started Lamictal, couldn't afford Vimpat anymore. Just on Lamictal now right?

## 2019-11-12 NOTE — Addendum Note (Signed)
Addended by: Oliver Hum S on: 11/12/2019 10:14 AM   Modules accepted: Orders

## 2019-11-14 ENCOUNTER — Telehealth: Payer: Self-pay | Admitting: Family Medicine

## 2019-11-14 DIAGNOSIS — M9901 Segmental and somatic dysfunction of cervical region: Secondary | ICD-10-CM | POA: Diagnosis not present

## 2019-11-14 DIAGNOSIS — M5417 Radiculopathy, lumbosacral region: Secondary | ICD-10-CM | POA: Diagnosis not present

## 2019-11-14 DIAGNOSIS — M9903 Segmental and somatic dysfunction of lumbar region: Secondary | ICD-10-CM | POA: Diagnosis not present

## 2019-11-14 DIAGNOSIS — M5414 Radiculopathy, thoracic region: Secondary | ICD-10-CM | POA: Diagnosis not present

## 2019-11-14 DIAGNOSIS — M9902 Segmental and somatic dysfunction of thoracic region: Secondary | ICD-10-CM | POA: Diagnosis not present

## 2019-11-14 DIAGNOSIS — S134XXA Sprain of ligaments of cervical spine, initial encounter: Secondary | ICD-10-CM | POA: Diagnosis not present

## 2019-11-14 NOTE — Telephone Encounter (Signed)
Error

## 2019-11-17 ENCOUNTER — Encounter: Payer: Self-pay | Admitting: Family Medicine

## 2019-11-17 ENCOUNTER — Ambulatory Visit (INDEPENDENT_AMBULATORY_CARE_PROVIDER_SITE_OTHER): Payer: PPO | Admitting: Family Medicine

## 2019-11-17 ENCOUNTER — Other Ambulatory Visit: Payer: Self-pay

## 2019-11-17 VITALS — BP 126/76 | HR 69 | Temp 98.2°F | Ht 65.0 in | Wt 162.5 lb

## 2019-11-17 DIAGNOSIS — E039 Hypothyroidism, unspecified: Secondary | ICD-10-CM

## 2019-11-17 DIAGNOSIS — E785 Hyperlipidemia, unspecified: Secondary | ICD-10-CM

## 2019-11-17 MED ORDER — SYNTHROID 100 MCG PO TABS: 100.0000 ug | ORAL_TABLET | Freq: Every day | ORAL | 3 refills | Status: AC

## 2019-11-17 NOTE — Assessment & Plan Note (Signed)
Patient will be moving out of state.  We will send in 1 year's worth of Synthroid 100 mcg daily.  She has been stable on this dose for several years.  She will establish care with a new PCP once she moves.

## 2019-11-17 NOTE — Patient Instructions (Signed)
It was very nice to see you today!  I will send in a refill on your Synthroid.   We wish you the best of luck with your move! Please let us know if we can be of any further assistance.  Take care, Dr Jerline Pain  Please try these tips to maintain a healthy lifestyle:   Eat at least 3 REAL meals and 1-2 snacks per day.  Aim for no more than 5 hours between eating.  If you eat breakfast, please do so within one hour of getting up.    Each meal should contain half fruits/vegetables, one quarter protein, and one quarter carbs (no bigger than a computer mouse)   Cut down on sweet beverages. This includes juice, soda, and sweet tea.     Drink at least 1 glass of water with each meal and aim for at least 8 glasses per day   Exercise at least 150 minutes every week.

## 2019-11-17 NOTE — Assessment & Plan Note (Signed)
Did not tolerate simvastatin due to cramps.  She will continue working on lifestyle modifications.  Advised her to follow-up with new PCP soon after moving to have lipids rechecked.

## 2019-11-17 NOTE — Progress Notes (Signed)
   TRAM WRENN is a 64 y.o. female who presents today for an office visit.  Assessment/Plan:  Chronic Problems Addressed Today: Hypothyroidism Patient will be moving out of state.  We will send in 1 year's worth of Synthroid 100 mcg daily.  She has been stable on this dose for several years.  She will establish care with a new PCP once she moves.  Dyslipidemia Did not tolerate simvastatin due to cramps.  She will continue working on lifestyle modifications.  Advised her to follow-up with new PCP soon after moving to have lipids rechecked.     Subjective:  HPI:  See A/p.         Objective:  Physical Exam: BP 126/76   Pulse 69   Temp 98.2 F (36.8 C)   Ht 5\' 5"  (1.651 m)   Wt 162 lb 8 oz (73.7 kg)   LMP 05/01/2004 Comment: or 2007  SpO2 96%   BMI 27.04 kg/m   Gen: No acute distress, resting comfortably Neuro: Grossly normal, moves all extremities Psych: Normal affect and thought content      Lavonia Eager M. Jerline Pain, MD 11/17/2019 9:56 AM

## 2019-11-21 DIAGNOSIS — S134XXA Sprain of ligaments of cervical spine, initial encounter: Secondary | ICD-10-CM | POA: Diagnosis not present

## 2019-11-21 DIAGNOSIS — M9903 Segmental and somatic dysfunction of lumbar region: Secondary | ICD-10-CM | POA: Diagnosis not present

## 2019-11-21 DIAGNOSIS — M9901 Segmental and somatic dysfunction of cervical region: Secondary | ICD-10-CM | POA: Diagnosis not present

## 2019-11-21 DIAGNOSIS — M5417 Radiculopathy, lumbosacral region: Secondary | ICD-10-CM | POA: Diagnosis not present

## 2019-11-21 DIAGNOSIS — M9902 Segmental and somatic dysfunction of thoracic region: Secondary | ICD-10-CM | POA: Diagnosis not present

## 2019-11-21 DIAGNOSIS — M5414 Radiculopathy, thoracic region: Secondary | ICD-10-CM | POA: Diagnosis not present

## 2020-01-07 ENCOUNTER — Encounter: Payer: Self-pay | Admitting: Neurology

## 2020-01-07 ENCOUNTER — Ambulatory Visit: Payer: PPO | Admitting: Neurology

## 2020-01-07 NOTE — Progress Notes (Deleted)
PATIENT: Sara Lawson DOB: 2005/06/07  REASON FOR VISIT: follow up HISTORY FROM: patient  HISTORY OF PRESENT ILLNESS: Today 01/07/20  HISTORY HISTORY: She started to have seizure since Jan 2010 after she felland slammed her head at an ice cooler at gas station, no LOC, after that she started to have the episodes.  She has past medical history of hypothyroidism, thyroid cysts, chronic neck pain, headache, presenting with 6 month history of intermittent episodes of confusion.  She reported all presentation has similar seminology. She would suddenly lost connection with the reality, feel like " I am not there". never totally lost consciousness, sometimes, it's proceed by smell some strong chemical smells, or exhaustion, stomach knot like sensation, with nausea. MRI :mild-moderate atrophy, EEG normal.  Jun 26 2012 She continues to have recurrent seizures, sometimes multiple episodes in a day, over years, she was changed to [place] XR, currently taking 500 mg 2 tablets every night, also on titrating dose of Vimpat, she is taking 150 mg twice a day,  Last seizure was in June 2013, in June 24 2012, she woke up having recurrent episode of seizure every 30-60 minutes, for a total of 7 episodes, stomach cramping, overwhelming sensation, lasting for 1 minutes, she took extra dose of 150 mg vimpat, one more recurrent episode afterwards, and then stop, she denies significant side effects from medications   UPDATE Jun 17 2018: She cannot tolerate Keppra due to significant side effect, was switched to Vimpat since March 2018, now taking 200 mg twice a day, tolerating it well, there was no longer recurrent episode,  Update 07/03/2019: She is doing well on Vimpat, but can no longer afford the medication.  She has not had recurrent seizure.  Her last seizure occurred in February 2014, described as an overwhelming sensation, like she was in an evil cartoon, and her stomach  clenches up.  She denies new problems or concerns.  Says routine lab work has been normal.   Update January 07, 2020 SS: When last seen, she was switched from Vimpat to Lamictal, due to no longer be able to afford the Vimpat.  REVIEW OF SYSTEMS: Out of a complete 14 system review of symptoms, the patient complains only of the following symptoms, and all other reviewed systems are negative.  ALLERGIES: Allergies  Allergen Reactions  . Cefdinir     diarrhea  . Keppra [Levetiracetam]     Burning in eyes  . Penicillins     REACTION: rash---she is able to take cephalosporins    HOME MEDICATIONS: Outpatient Medications Prior to Visit  Medication Sig Dispense Refill  . cholecalciferol (VITAMIN D) 1000 units tablet Take 2,000 Units by mouth daily.    Marland Kitchen lamoTRIgine (LAMICTAL) 100 MG tablet Take 1 tablet (100 mg total) by mouth 2 (two) times daily. 180 tablet 0  . MAGNESIUM PO Take 1 tablet by mouth daily.    . Multiple Vitamins-Minerals (CENTRUM SILVER 50+WOMEN) TABS Take by mouth. One daily    . Omega-3 Fatty Acids (FISH OIL) 1000 MG CAPS Take by mouth. One daily    . SYNTHROID 100 MCG tablet Take 1 tablet (100 mcg total) by mouth daily before breakfast. 90 tablet 3   No facility-administered medications prior to visit.    PAST MEDICAL HISTORY: Past Medical History:  Diagnosis Date  . Arthritis   . Benign ovarian tumor   . Celiac disease    diag 2014 Dr Collene Mares  . Chronic head pain   . Chronic neck  pain   . Colon polyps   . Diverticulitis   . Hyperlipidemia   . Migraine    not currently  . Seizure ([place])     PAST SURGICAL HISTORY: Past Surgical History:  Procedure Laterality Date  . appendix removed    . AUGMENTATION MAMMAPLASTY Bilateral 2002  . BREAST ENHANCEMENT SURGERY    . HYSTEROTOMY    . OOPHORECTOMY     left ovary removed-tumor on ovary  . TONSILLECTOMY      FAMILY HISTORY: Family History  Problem Relation Age of Onset  . Colon cancer Mother        had  hysterectomy in late 30s-not sure if due to any problems, ? Celiac disease  . Hypertension Mother   . Diabetes Mother   . Heart disease Mother   . Dementia Mother   . Colon cancer Maternal Uncle        x2  . Diabetes Maternal Grandmother   . Diabetes Maternal Uncle        x2  . Diabetes Maternal Uncle   . Stroke Other        maternal great grandfather-diabetes  . Thyroid disease Other        maternal side  . Diabetes Sister   . Diabetes Brother        type 2    SOCIAL HISTORY: Social History   Socioeconomic History  . Marital status: Married    Spouse name: Mikki Santee  . Number of children: 3  . Years of education: Not on file  . Highest education level: Not on file  Occupational History  . Not on file  Tobacco Use  . Smoking status: Current Every Day Smoker    Packs/day: 0.75    Years: 50.00    Pack years: 37.50    Types: Cigarettes  . Smokeless tobacco: Never Used  Substance and Sexual Activity  . Alcohol use: Yes    Alcohol/week: 0.0 standard drinks    Comment: occ  . Drug use: No  . Sexual activity: Never    Partners: Male  Other Topics Concern  . Not on file  Social History Narrative   Patient lives at home with her husband Mikki Santee and been married for 10 years.    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   Patient has 3 children.   Patient has some college education.   Patient drinks 6 cups of coffee daily.       Social Determinants of Health   Financial Resource Strain:   . Difficulty of Paying Living Expenses: Not on file  Food Insecurity:   . Worried About Charity fundraiser in the Last Year: Not on file  . Ran Out of Food in the Last Year: Not on file  Transportation Needs:   . Lack of Transportation (Medical): Not on file  . Lack of Transportation (Non-Medical): Not on file  Physical Activity:   . Days of Exercise per Week: Not on file  . Minutes of Exercise per Session: Not on file  Stress:   . Feeling of Stress : Not on file  Social  Connections:   . Frequency of Communication with Friends and Family: Not on file  . Frequency of Social Gatherings with Friends and Family: Not on file  . Attends Religious Services: Not on file  . Active Member of Clubs or Organizations: Not on file  . Attends Archivist Meetings: Not on file  . Marital Status: Not on file  Intimate Partner Violence:   . Fear of Current or Ex-Partner: Not on file  . Emotionally Abused: Not on file  . Physically Abused: Not on file  . Sexually Abused: Not on file      PHYSICAL EXAM  There were no vitals filed for this visit. There is no height or weight on file to calculate BMI.  Generalized: Well developed, in no acute distress   Neurological examination  Mentation: Alert oriented to time, place, history taking. Follows all commands speech and language fluent Cranial nerve II-XII: Pupils were equal round reactive to light. Extraocular movements were full, visual field were full on confrontational test. Facial sensation and strength were normal. Uvula tongue midline. Head turning and shoulder shrug  were normal and symmetric. Motor: The motor testing reveals 5 over 5 strength of all 4 extremities. Good symmetric motor tone is noted throughout.  Sensory: Sensory testing is intact to soft touch on all 4 extremities. No evidence of extinction is noted.  Coordination: Cerebellar testing reveals good finger-nose-finger and heel-to-shin bilaterally.  Gait and station: Gait is normal. Tandem gait is normal. Romberg is negative. No drift is seen.  Reflexes: Deep tendon reflexes are symmetric and normal bilaterally.   DIAGNOSTIC DATA (LABS, IMAGING, TESTING) - I reviewed patient records, labs, notes, testing and imaging myself where available.  No results found for: WBC, HGB, HCT, MCV, PLT No results found for: NA, K, CL, CO2, GLUCOSE, BUN, CREATININE, CALCIUM, PROT, ALBUMIN, AST, ALT, ALKPHOS, BILITOT, GFRNONAA, GFRAA No results found for:  CHOL, HDL, LDLCALC, LDLDIRECT, TRIG, CHOLHDL No results found for: HGBA1C No results found for: VITAMINB12 No results found for: TSH    ASSESSMENT AND PLAN 64 y.o. year old female  has a past medical history of Arthritis, Benign ovarian tumor, Celiac disease, Chronic head pain, Chronic neck pain, Colon polyps, Diverticulitis, Hyperlipidemia, Migraine, and Seizure ([place]). here with ***   I spent 15 minutes with the patient. 50% of this time was spent   Butler Denmark, [place], DNP 01/07/2020, 5:57 AM [facility] [address], [place], [place] [address] [phone]

## 2020-01-13 ENCOUNTER — Encounter: Payer: PPO | Admitting: Family Medicine

## 2021-07-23 IMAGING — CT CT CHEST LUNG CANCER SCREENING LOW DOSE W/O CM
1 series · 10 of 10 positions shown, 13 images · non-contrast
Comparison: None.

CLINICAL DATA: 64-year-old female current smoker, with 37 pack-year
history of smoking, initial lung cancer screening

EXAM:
CT CHEST WITHOUT CONTRAST LOW-DOSE FOR LUNG CANCER SCREENING
TECHNIQUE: Multidetector CT imaging of the chest was performed following the
standard protocol without IV contrast.

[ct lung segmentation data · axial · 0.76mm/px · z∈[+1325,+1325]mm · 10 of 301 frames shown]
[frame 1/301  mediastinal]
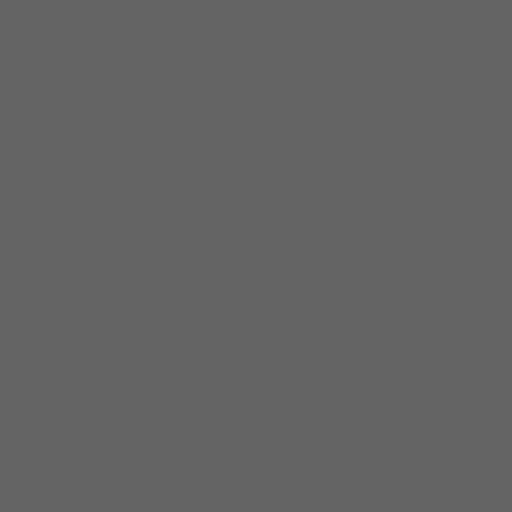
[frame 1/301  lung]
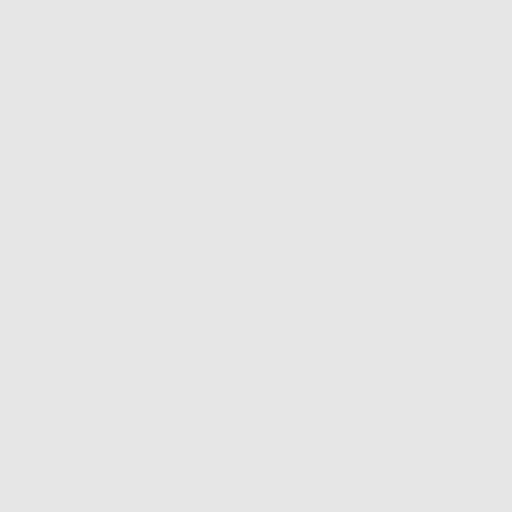
[frame 34/301  lung]
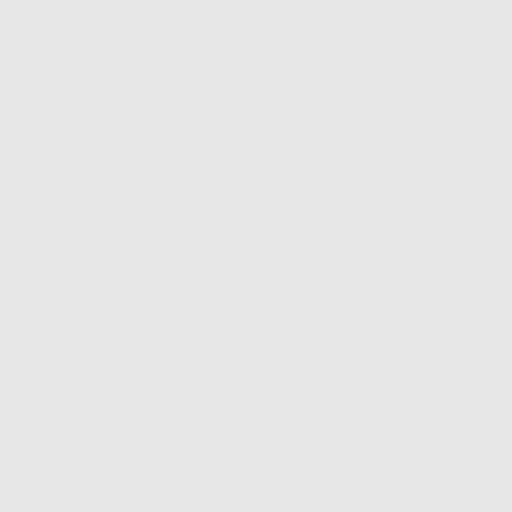
[frame 67/301  lung]
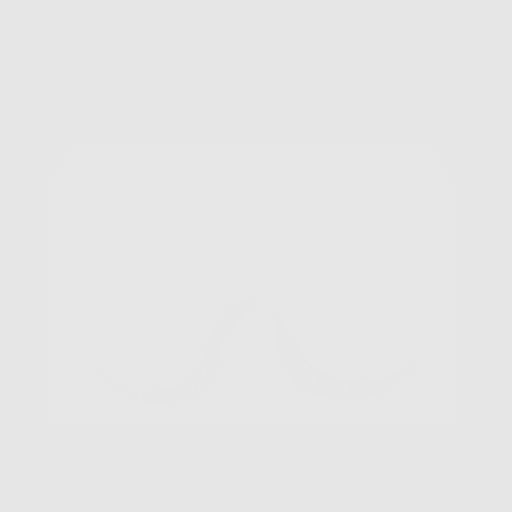
[frame 101/301  lung]
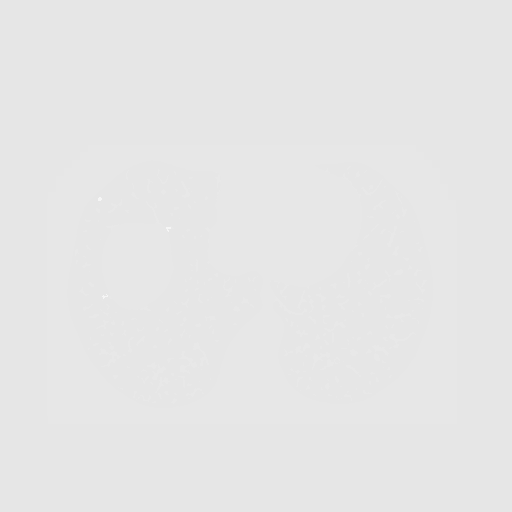
[frame 134/301  mediastinal]
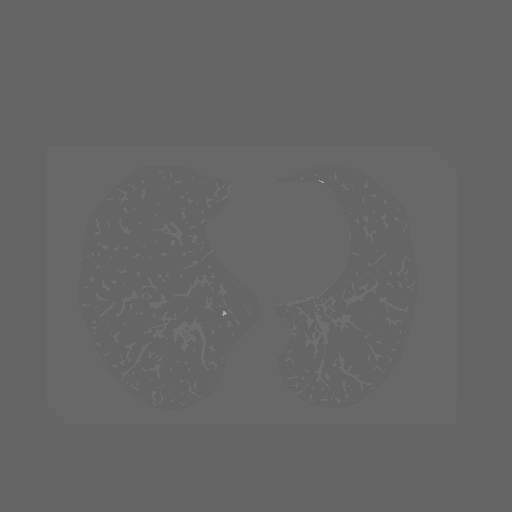
[frame 134/301  lung]
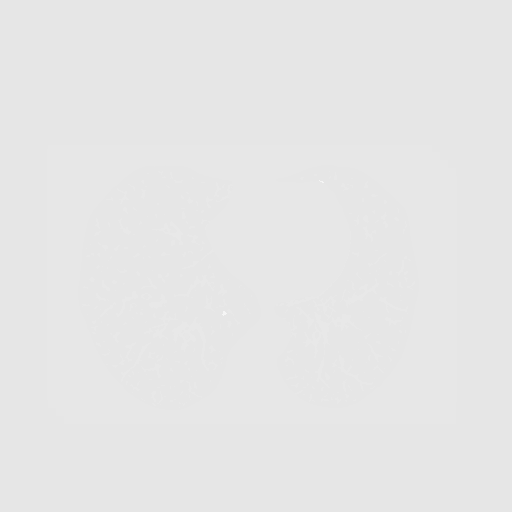
[frame 167/301  lung]
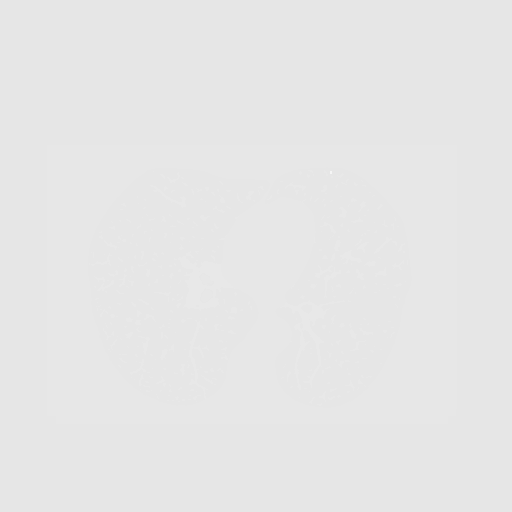
[frame 201/301  lung]
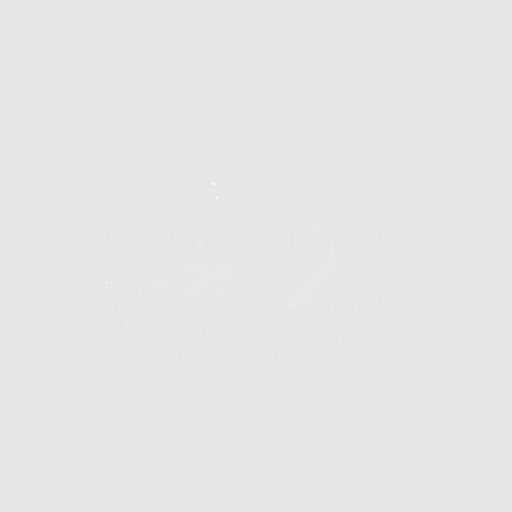
[frame 234/301  lung]
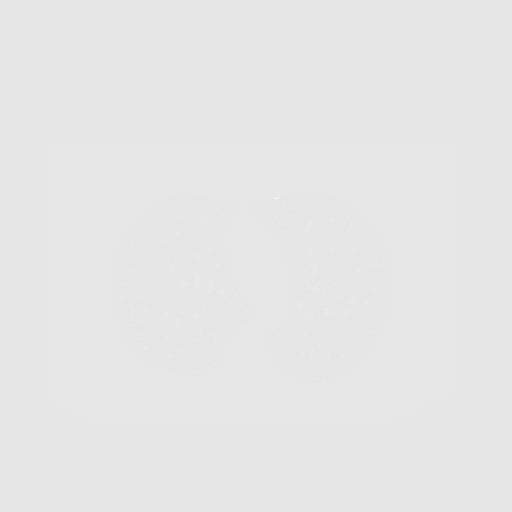
[frame 267/301  mediastinal]
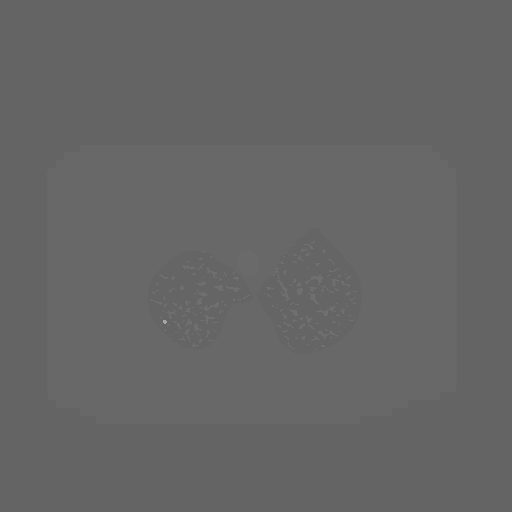
[frame 267/301  lung]
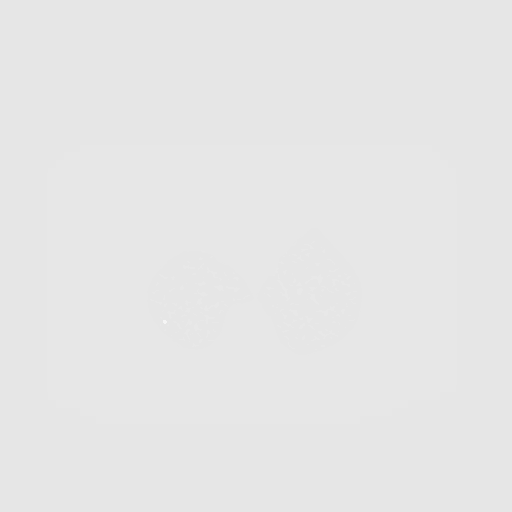
[frame 301/301  lung]
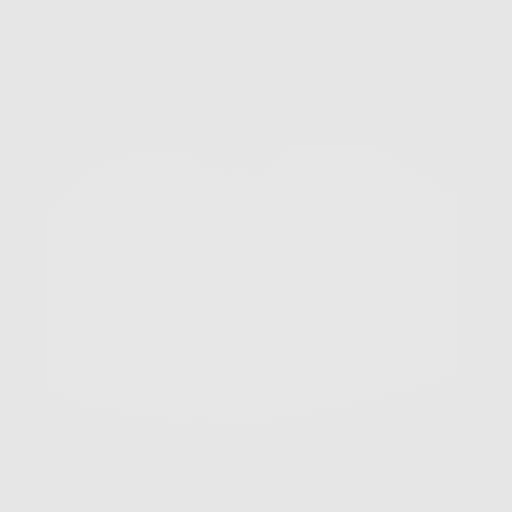

[10 of 10 positions shown; findings below may reference images not displayed]

FINDINGS: Cardiovascular: Heart is normal in size.  No pericardial effusion.

No evidence of thoracic aortic aneurysm. Atherosclerotic
calcifications of the aortic arch.

Coronary atherosclerosis of the LAD and right coronary artery.

Mediastinum/Nodes: No suspicious mediastinal lymphadenopathy.
Calcified mediastinal and bilateral hilar lymph.

Visualized thyroid is unremarkable.

Lungs/Pleura: Scattered calcified granulomata measuring up 4.2 mm,
benign.

Two subpleural left upper nodules measuring up to 2.7 mm.

Mild centrilobular emphysematous changes predominant.

No focal consolidation.

No pleural effusion or pneumothorax.

Upper Abdomen: Visualized upper abdomen is grossly unremarkable.

Musculoskeletal: Visualized osseous structures are within normal
limits. Bilateral breast augmentation.
IMPRESSION: Lung-RADS 2, benign appearance or behavior. Continue annual
screening with low-dose chest CT without contrast in 12 months.

Aortic Atherosclerosis (T7DT3-RL6.6) and Emphysema (T7DT3-RRZ.4).
